# Patient Record
Sex: Female | Born: 1965 | Race: Black or African American | Hispanic: No | Marital: Married | Smoking: Never smoker
Health system: Southern US, Community
[De-identification: ages and names within clinical notes are randomized; demographics above are authoritative.]

## PROBLEM LIST (undated history)

## (undated) DIAGNOSIS — E785 Hyperlipidemia, unspecified: Secondary | ICD-10-CM

## (undated) DIAGNOSIS — R55 Syncope and collapse: Secondary | ICD-10-CM

## (undated) DIAGNOSIS — M79609 Pain in unspecified limb: Secondary | ICD-10-CM

## (undated) DIAGNOSIS — M25559 Pain in unspecified hip: Secondary | ICD-10-CM

## (undated) DIAGNOSIS — M1990 Unspecified osteoarthritis, unspecified site: Secondary | ICD-10-CM

## (undated) DIAGNOSIS — J301 Allergic rhinitis due to pollen: Secondary | ICD-10-CM

## (undated) DIAGNOSIS — K449 Diaphragmatic hernia without obstruction or gangrene: Secondary | ICD-10-CM

## (undated) DIAGNOSIS — E559 Vitamin D deficiency, unspecified: Secondary | ICD-10-CM

## (undated) DIAGNOSIS — T7840XA Allergy, unspecified, initial encounter: Secondary | ICD-10-CM

## (undated) HISTORY — DX: Pain in unspecified hip: M25.559

## (undated) HISTORY — DX: Hyperlipidemia, unspecified: E78.5

## (undated) HISTORY — DX: Vitamin D deficiency, unspecified: E55.9

## (undated) HISTORY — DX: Diaphragmatic hernia without obstruction or gangrene: K44.9

## (undated) HISTORY — DX: Pain in unspecified limb: M79.609

## (undated) HISTORY — DX: Allergic rhinitis due to pollen: J30.1

## (undated) HISTORY — DX: Syncope and collapse: R55

## (undated) HISTORY — DX: Unspecified osteoarthritis, unspecified site: M19.90

## (undated) HISTORY — PX: TUBAL LIGATION: SHX77

## (undated) HISTORY — DX: Allergy, unspecified, initial encounter: T78.40XA

---

## 1998-05-08 ENCOUNTER — Ambulatory Visit (HOSPITAL_COMMUNITY): Admission: RE | Admit: 1998-05-08 | Discharge: 1998-05-08 | Payer: Self-pay | Admitting: Unknown Physician Specialty

## 1998-10-23 ENCOUNTER — Emergency Department (HOSPITAL_COMMUNITY): Admission: EM | Admit: 1998-10-23 | Discharge: 1998-10-23 | Payer: Self-pay | Admitting: Emergency Medicine

## 1998-10-23 ENCOUNTER — Encounter: Payer: Self-pay | Admitting: Emergency Medicine

## 1999-07-05 ENCOUNTER — Ambulatory Visit: Admission: RE | Admit: 1999-07-05 | Discharge: 1999-07-05 | Payer: Self-pay | Admitting: Gynecology

## 1999-08-22 ENCOUNTER — Encounter (INDEPENDENT_AMBULATORY_CARE_PROVIDER_SITE_OTHER): Payer: Self-pay | Admitting: Specialist

## 1999-08-22 ENCOUNTER — Inpatient Hospital Stay (HOSPITAL_COMMUNITY): Admission: RE | Admit: 1999-08-22 | Discharge: 1999-08-23 | Payer: Self-pay | Admitting: Gynecology

## 2000-02-24 ENCOUNTER — Encounter: Admission: RE | Admit: 2000-02-24 | Discharge: 2000-02-24 | Payer: Self-pay | Admitting: Internal Medicine

## 2000-02-24 ENCOUNTER — Encounter: Payer: Self-pay | Admitting: Internal Medicine

## 2000-08-31 ENCOUNTER — Encounter: Payer: Self-pay | Admitting: Internal Medicine

## 2000-08-31 ENCOUNTER — Emergency Department (HOSPITAL_COMMUNITY): Admission: EM | Admit: 2000-08-31 | Discharge: 2000-08-31 | Payer: Self-pay | Admitting: Emergency Medicine

## 2000-09-16 ENCOUNTER — Ambulatory Visit (HOSPITAL_COMMUNITY): Admission: RE | Admit: 2000-09-16 | Discharge: 2000-09-16 | Payer: Self-pay | Admitting: Gastroenterology

## 2000-09-17 ENCOUNTER — Emergency Department (HOSPITAL_COMMUNITY): Admission: EM | Admit: 2000-09-17 | Discharge: 2000-09-17 | Payer: Self-pay | Admitting: Emergency Medicine

## 2000-09-17 ENCOUNTER — Encounter: Payer: Self-pay | Admitting: Emergency Medicine

## 2001-05-25 ENCOUNTER — Encounter: Payer: Self-pay | Admitting: Internal Medicine

## 2001-05-25 ENCOUNTER — Ambulatory Visit (HOSPITAL_COMMUNITY): Admission: RE | Admit: 2001-05-25 | Discharge: 2001-05-25 | Payer: Self-pay | Admitting: Internal Medicine

## 2002-09-30 ENCOUNTER — Encounter: Payer: Self-pay | Admitting: Internal Medicine

## 2002-09-30 ENCOUNTER — Encounter: Admission: RE | Admit: 2002-09-30 | Discharge: 2002-09-30 | Payer: Self-pay | Admitting: Internal Medicine

## 2002-11-08 ENCOUNTER — Other Ambulatory Visit: Admission: RE | Admit: 2002-11-08 | Discharge: 2002-11-08 | Payer: Self-pay | Admitting: Gynecology

## 2003-03-15 ENCOUNTER — Emergency Department (HOSPITAL_COMMUNITY): Admission: EM | Admit: 2003-03-15 | Discharge: 2003-03-15 | Payer: Self-pay | Admitting: Emergency Medicine

## 2003-03-22 ENCOUNTER — Emergency Department (HOSPITAL_COMMUNITY): Admission: EM | Admit: 2003-03-22 | Discharge: 2003-03-22 | Payer: Self-pay | Admitting: Emergency Medicine

## 2003-12-26 ENCOUNTER — Other Ambulatory Visit: Admission: RE | Admit: 2003-12-26 | Discharge: 2003-12-26 | Payer: Self-pay | Admitting: Gynecology

## 2004-03-25 ENCOUNTER — Encounter: Admission: RE | Admit: 2004-03-25 | Discharge: 2004-03-25 | Payer: Self-pay | Admitting: Internal Medicine

## 2005-01-01 ENCOUNTER — Other Ambulatory Visit: Admission: RE | Admit: 2005-01-01 | Discharge: 2005-01-01 | Payer: Self-pay | Admitting: Gynecology

## 2006-02-18 ENCOUNTER — Other Ambulatory Visit: Admission: RE | Admit: 2006-02-18 | Discharge: 2006-02-18 | Payer: Self-pay | Admitting: Gynecology

## 2006-03-17 ENCOUNTER — Encounter: Admission: RE | Admit: 2006-03-17 | Discharge: 2006-03-17 | Payer: Self-pay | Admitting: Internal Medicine

## 2007-02-22 ENCOUNTER — Other Ambulatory Visit: Admission: RE | Admit: 2007-02-22 | Discharge: 2007-02-22 | Payer: Self-pay | Admitting: Gynecology

## 2007-08-17 ENCOUNTER — Encounter: Admission: RE | Admit: 2007-08-17 | Discharge: 2007-08-17 | Payer: Self-pay | Admitting: Internal Medicine

## 2007-09-15 ENCOUNTER — Ambulatory Visit (HOSPITAL_COMMUNITY): Admission: RE | Admit: 2007-09-15 | Discharge: 2007-09-15 | Payer: Self-pay | Admitting: Gastroenterology

## 2007-09-24 ENCOUNTER — Ambulatory Visit (HOSPITAL_COMMUNITY): Admission: RE | Admit: 2007-09-24 | Discharge: 2007-09-24 | Payer: Self-pay | Admitting: Gastroenterology

## 2008-06-19 ENCOUNTER — Other Ambulatory Visit: Admission: RE | Admit: 2008-06-19 | Discharge: 2008-06-19 | Payer: Self-pay | Admitting: Gynecology

## 2008-07-10 ENCOUNTER — Emergency Department (HOSPITAL_COMMUNITY): Admission: EM | Admit: 2008-07-10 | Discharge: 2008-07-10 | Payer: Self-pay | Admitting: Emergency Medicine

## 2008-12-25 ENCOUNTER — Ambulatory Visit (HOSPITAL_COMMUNITY): Admission: RE | Admit: 2008-12-25 | Discharge: 2008-12-25 | Payer: Self-pay | Admitting: Gastroenterology

## 2009-10-24 ENCOUNTER — Encounter: Admission: RE | Admit: 2009-10-24 | Discharge: 2009-11-27 | Payer: Self-pay | Admitting: Internal Medicine

## 2010-01-28 ENCOUNTER — Emergency Department (HOSPITAL_COMMUNITY): Admission: EM | Admit: 2010-01-28 | Discharge: 2010-01-28 | Payer: Self-pay | Admitting: Emergency Medicine

## 2010-02-11 ENCOUNTER — Emergency Department (HOSPITAL_COMMUNITY): Admission: EM | Admit: 2010-02-11 | Discharge: 2010-02-11 | Payer: Self-pay | Admitting: Emergency Medicine

## 2010-12-22 ENCOUNTER — Encounter: Payer: Self-pay | Admitting: Gastroenterology

## 2010-12-22 ENCOUNTER — Encounter: Payer: Self-pay | Admitting: Neurosurgery

## 2011-02-19 ENCOUNTER — Ambulatory Visit
Admission: RE | Admit: 2011-02-19 | Discharge: 2011-02-19 | Disposition: A | Discharge status: Home / Self Care | Payer: BC Managed Care – PPO | Source: Ambulatory Visit | Attending: Internal Medicine | Admitting: Internal Medicine

## 2011-02-19 ENCOUNTER — Other Ambulatory Visit: Payer: Self-pay | Admitting: Internal Medicine

## 2011-02-19 DIAGNOSIS — R52 Pain, unspecified: Secondary | ICD-10-CM

## 2011-03-19 ENCOUNTER — Encounter: Payer: Self-pay | Admitting: Internal Medicine

## 2011-03-20 ENCOUNTER — Other Ambulatory Visit (HOSPITAL_COMMUNITY): Payer: Self-pay | Admitting: Cardiology

## 2011-04-07 ENCOUNTER — Ambulatory Visit (HOSPITAL_COMMUNITY)
Admission: RE | Admit: 2011-04-07 | Discharge: 2011-04-07 | Disposition: A | Discharge status: Home / Self Care | Payer: BC Managed Care – PPO | Source: Ambulatory Visit | Attending: Cardiology | Admitting: Cardiology

## 2011-04-07 ENCOUNTER — Encounter (HOSPITAL_COMMUNITY)
Admission: RE | Admit: 2011-04-07 | Discharge: 2011-04-07 | Disposition: A | Discharge status: Home / Self Care | Payer: BC Managed Care – PPO | Source: Ambulatory Visit | Attending: Cardiology | Admitting: Cardiology

## 2011-04-07 DIAGNOSIS — R079 Chest pain, unspecified: Secondary | ICD-10-CM | POA: Insufficient documentation

## 2011-04-07 DIAGNOSIS — R5381 Other malaise: Secondary | ICD-10-CM | POA: Insufficient documentation

## 2011-04-07 DIAGNOSIS — R42 Dizziness and giddiness: Secondary | ICD-10-CM | POA: Insufficient documentation

## 2011-04-07 MED ORDER — TECHNETIUM TC 99M TETROFOSMIN IV KIT
30.0000 | PACK | Freq: Once | INTRAVENOUS | Status: AC | PRN
  Administered 2011-04-07: 30 via INTRAVENOUS

## 2011-04-07 MED ORDER — TECHNETIUM TC 99M TETROFOSMIN IV KIT
10.0000 | PACK | Freq: Once | INTRAVENOUS | Status: AC | PRN
  Administered 2011-04-07: 10 via INTRAVENOUS

## 2011-04-18 NOTE — Procedures (Signed)
[place]. [facility]  Patient:    Shelby Jensen, Shelby Jensen                       MRN: [id number] Proc. Date: 09/16/00 Adm. Date:  [id number] Attending:  Charna Elizabeth CC:         Lind Guest. August Saucer, M.D.   Procedure Report  DATE OF BIRTH:  August 30, 1956  REFERRING PHYSICIAN:  Lind Guest. August Saucer, M.D.  PROCEDURE PERFORMED:  Colonoscopy.  ENDOSCOPIST:  Anselmo Rod, M.D.  INSTRUMENT USED:  Olympus video colonoscope.  INDICATIONS FOR PROCEDURE:  Blood in stool and diarrhea in a 45 year old black female rule out colitis, masses, polyps, etc.  PREPROCEDURE PREPARATION:  Informed consent was procured from the patient. The patient was fasted for eight hours prior to the procedure and prepped with a bottle of magnesium citrate and a gallon of NuLytely the night prior to the procedure.  PREPROCEDURE PHYSICAL:  The patient had stable vital signs.  Neck supple. Chest clear to auscultation.  S1, S2 regular.  Abdomen soft with normal abdominal bowel sounds.  DESCRIPTION OF PROCEDURE:  The patient was placed in the left lateral decubitus position and sedated with 40 mg of Demerol and 4 mg of Versed intravenously.  Once the patient was adequately sedated and maintained on low-flow oxygen and continuous cardiac monitoring, the Olympus video colonoscope was advanced from the rectum to the cecum and terminal ileum without difficulty.  No abnormalities were noted, no masses polyps, erosions or ulcerations were seen.  There was a small amount of residual stool in the colon but visualization was adequate.  There was no evidence of diverticulosis.  IMPRESSION:  Normal colonoscopy up to terminal ileum.  RECOMMENDATIONS:  The patient was advised to increase the fluid and fiber in her diet and follow up in the office in the next two weeks.DD:  09/16/00 TD:  09/16/00 Job: [id number]

## 2011-04-18 NOTE — Consult Note (Signed)
[place]. [facility]  Patient:    Shelby Jensen, Shelby Jensen                       MRN: [id number] Proc. Date: 08/31/00 Adm. Date:  [id number] Attending:  Sandi Raveling                          Consultation Report  EMERGENCY ROOM NOTE:  CHIEF COMPLAINT:  Abdominal pain.  HISTORY OF PRESENT ILLNESS:  The patient is a 45 year old married black female, who presented to the office complaining of recurrent abdominal pain of one days duration.  States she had been feeling well up until yesterday.  She noted the severe crampy lower abdominal pain.  She subsequently developed diarrhea with intermittent bloody stools noted.  She became flushed without associated syncope.  She notably had had problems with constipation prior to the onset of loose stools.  The patient became concerned and came to the office for evaluation.  Vital signs were stable in the office, but she was referred to the emergency room for the evaluation.  The patient has not had similar episodes of crampy pain.  She has had intermittent bouts of constipation.  She has not seen blood in her stools before.  She denies fever, chills, or night sweats.  No unusual eating habits.  No one in the family has been ill as well.  The patient has not taken any NSAIDs or aspirin products.  She does not drink.  Does not smoke.  She drinks one or two cups of caffeine on a daily basis.  PAST MEDICAL HISTORY:  Remarkable for allergic rhinitis.  She has been on intermittent Allegra or Claritin in the past.  ALLERGIES:  CODEINE.  PHYSICAL EXAMINATION:  GENERAL:  She is a well-developed, well-nourished black female in no acute distress.  VITAL SIGNS:  Blood pressure of 109/67, pulse 61, respiratory rate 18, temperature 98.1.  O2 saturation 98% on room air.  HEENT:  There is no sinus tenderness.  Mild turbinate edema.  TMs are clear.  NECK:  Supple.  No evidence of cervical nodes.  LUNGS:  Clear without  wheezes.  CARDIOVASCULAR:  Normal S1, S2.  No S3.  ABDOMEN:  Bowel sounds are present.  Slightly tympanic.  Dullness to the left lower quadrant.  RECTAL:  Notable for small internal hemorrhoidal tag.  No stool obtainable on exam.  EXTREMITIES:  Negative Homans, no edema.  DIAGNOSTIC EVALUATION:  X-ray of the abdomen shows slight increase in stool without blockage.  Laboratory data:  CBC revealed WBC 8300, hemoglobin 12.6, hematocrit 37.0.  Normal differential.  Sedimentation rate was normal. Electrolytes:  Sodium 136, potassium 3.5, chloride 105, CO2 27, BUN 8, creatinine 0.8, calcium 8.6, total protein 7.2, albumin 3.7, SGOT and SGPT normal, alkaline phosphatase is normal, bilirubin normal.  Amylase 67. Sedimentation rate, as noted, 17.  HOSPITAL COURSE:  The patient was observed in the emergency room over the past six hours.  She notably had no bowel movements.  No evidence of hematochezia. She had no nausea or vomiting.  The patient was given IV fluids for mild rehydration.  She notably on follow-up exam felt well.  She was deemed to be stable for discharge home with outpatient follow-up.  IMPRESSION: 1. Colonic dysfunction. 2. Abdominal pain secondary to spasms. 3. History of rectal bleeding, possibly secondary to internal hemorrhoids,    presently without any observed on observation in the emergency  room.    Sedimentation rate being normal, it is not consistent with an inflammatory    bowel disease at this time.  No strong family history of such as well.  PLAN:  The patient has been given Bentyl 2 mg IM.  She will be given Levsin sublingual for severe spasms.  Have referred the patient to Anselmo Rod, M.D., to be evaluated at 2 p.m. tomorrow.  She may still well benefit from a colon evaluation.  Will also give her Miralax to start using for constipation. Levsin sublingual one q.4h. p.r.n. severe spasms.  Follow up in the office otherwise in one weeks time. DD:   08/31/00 TD:  09/01/00 Job: [id number]

## 2012-01-29 ENCOUNTER — Other Ambulatory Visit: Payer: Self-pay | Admitting: Gastroenterology

## 2012-01-29 DIAGNOSIS — K219 Gastro-esophageal reflux disease without esophagitis: Secondary | ICD-10-CM

## 2012-01-29 DIAGNOSIS — R109 Unspecified abdominal pain: Secondary | ICD-10-CM

## 2012-02-10 ENCOUNTER — Ambulatory Visit (HOSPITAL_COMMUNITY)
Admission: RE | Admit: 2012-02-10 | Discharge: 2012-02-10 | Disposition: A | Discharge status: Home / Self Care | Payer: BC Managed Care – PPO | Source: Ambulatory Visit | Attending: Gastroenterology | Admitting: Gastroenterology

## 2012-02-10 DIAGNOSIS — R109 Unspecified abdominal pain: Secondary | ICD-10-CM | POA: Insufficient documentation

## 2012-02-10 DIAGNOSIS — K219 Gastro-esophageal reflux disease without esophagitis: Secondary | ICD-10-CM

## 2012-02-10 MED ORDER — SINCALIDE 5 MCG IJ SOLR
INTRAMUSCULAR | Status: AC
  Administered 2012-02-10: 1.32 ug via INTRAVENOUS
  Filled 2012-02-10: qty 5

## 2012-02-10 MED ORDER — TECHNETIUM TC 99M MEBROFENIN IV KIT
5.0000 | PACK | Freq: Once | INTRAVENOUS | Status: AC | PRN
  Administered 2012-02-10: 5 via INTRAVENOUS

## 2012-06-10 LAB — LIPID PANEL
Cholesterol: 176 mg/dL (ref 0–200)
Triglycerides: 60 mg/dL (ref 40–160)

## 2012-08-19 ENCOUNTER — Other Ambulatory Visit: Payer: Self-pay | Admitting: Gastroenterology

## 2012-08-19 DIAGNOSIS — R110 Nausea: Secondary | ICD-10-CM

## 2012-08-19 DIAGNOSIS — R1013 Epigastric pain: Secondary | ICD-10-CM

## 2012-09-01 ENCOUNTER — Encounter (HOSPITAL_COMMUNITY)
Admission: RE | Admit: 2012-09-01 | Discharge: 2012-09-01 | Disposition: A | Discharge status: Home / Self Care | Payer: BC Managed Care – PPO | Source: Ambulatory Visit | Attending: Gastroenterology | Admitting: Gastroenterology

## 2012-09-01 DIAGNOSIS — R110 Nausea: Secondary | ICD-10-CM | POA: Insufficient documentation

## 2012-09-01 DIAGNOSIS — R1013 Epigastric pain: Secondary | ICD-10-CM | POA: Insufficient documentation

## 2012-09-01 LAB — HM MAMMOGRAPHY

## 2012-09-01 MED ORDER — TECHNETIUM TC 99M SULFUR COLLOID
2.0000 | Freq: Once | INTRAVENOUS | Status: AC | PRN
  Administered 2012-09-01: 2 via ORAL

## 2013-01-28 ENCOUNTER — Encounter: Payer: Self-pay | Admitting: General Practice

## 2013-01-28 DIAGNOSIS — F439 Reaction to severe stress, unspecified: Secondary | ICD-10-CM | POA: Insufficient documentation

## 2013-01-28 DIAGNOSIS — E669 Obesity, unspecified: Secondary | ICD-10-CM | POA: Insufficient documentation

## 2014-10-22 ENCOUNTER — Emergency Department (HOSPITAL_COMMUNITY)
Admission: EM | Admit: 2014-10-22 | Discharge: 2014-10-22 | Disposition: A | Discharge status: Home / Self Care | Payer: BC Managed Care – PPO | Attending: Emergency Medicine | Admitting: Emergency Medicine

## 2014-10-22 ENCOUNTER — Encounter (HOSPITAL_COMMUNITY): Payer: Self-pay | Admitting: Emergency Medicine

## 2014-10-22 DIAGNOSIS — E559 Vitamin D deficiency, unspecified: Secondary | ICD-10-CM | POA: Diagnosis not present

## 2014-10-22 DIAGNOSIS — Z8719 Personal history of other diseases of the digestive system: Secondary | ICD-10-CM | POA: Insufficient documentation

## 2014-10-22 DIAGNOSIS — Z79899 Other long term (current) drug therapy: Secondary | ICD-10-CM | POA: Insufficient documentation

## 2014-10-22 DIAGNOSIS — G43009 Migraine without aura, not intractable, without status migrainosus: Secondary | ICD-10-CM | POA: Diagnosis not present

## 2014-10-22 DIAGNOSIS — G43909 Migraine, unspecified, not intractable, without status migrainosus: Secondary | ICD-10-CM | POA: Diagnosis present

## 2014-10-22 MED ORDER — DIPHENHYDRAMINE HCL 50 MG/ML IJ SOLN
12.5000 mg | Freq: Once | INTRAMUSCULAR | Status: AC
  Administered 2014-10-22: 12.5 mg via INTRAVENOUS
  Filled 2014-10-22: qty 1

## 2014-10-22 MED ORDER — KETOROLAC TROMETHAMINE 30 MG/ML IJ SOLN
30.0000 mg | Freq: Once | INTRAMUSCULAR | Status: AC
  Administered 2014-10-22: 30 mg via INTRAVENOUS
  Filled 2014-10-22: qty 1

## 2014-10-22 MED ORDER — METOCLOPRAMIDE HCL 5 MG/ML IJ SOLN
10.0000 mg | Freq: Once | INTRAMUSCULAR | Status: AC
  Administered 2014-10-22: 10 mg via INTRAVENOUS
  Filled 2014-10-22: qty 2

## 2014-10-22 NOTE — ED Provider Notes (Signed)
CSN: [id number]     Arrival date & time 10/22/14  0307 History   First MD Initiated Contact with Patient 10/22/14 0320     Chief Complaint  Patient presents with  . Migraine     (Consider location/radiation/quality/duration/timing/severity/associated sxs/prior Treatment) Patient is a 48 y.o. female presenting with migraines. The history is provided by the patient. No language interpreter was used.  Migraine This is a new problem. The current episode started today. Associated symptoms include headaches. Pertinent negatives include no chills, fever, nausea or vomiting. Associated symptoms comments: Left sided headache in pattern of previous migraine headaches. She states she was formerly a patient at the [facility] but has not required treatment for a long time. No fever, injury, nausea or vomiting. Positive for photophobia..    Past Medical History  Diagnosis Date  . Syncope and collapse   . Pain in limb   . Unspecified vitamin D deficiency   . Allergic rhinitis due to pollen   . Pain in joint, pelvic region and thigh   . Diaphragmatic hernia without mention of obstruction or gangrene   . Hyperlipidemia    History reviewed. No pertinent past surgical history. History reviewed. No pertinent family history. History  Substance Use Topics  . Smoking status: Never Smoker   . Smokeless tobacco: Never Used  . Alcohol Use: No   OB History    No data available     Review of Systems  Constitutional: Negative for fever and chills.  Eyes: Positive for photophobia.  Respiratory: Negative.   Cardiovascular: Negative.   Gastrointestinal: Negative.  Negative for nausea and vomiting.  Musculoskeletal: Negative.   Skin: Negative.   Neurological: Positive for headaches.      Allergies  Codeine; Darvocet; and Tramadol  Home Medications   Prior to Admission medications   Medication Sig Start Date End Date Taking? Authorizing Provider  cholecalciferol (VITAMIN D) 1000  UNITS tablet Take 1,000 Units by mouth daily.    Historical Provider, MD  Multiple Vitamin (MULTIVITAMIN) tablet Take 1 tablet by mouth daily.      Historical Provider, MD   BP 120/48 mmHg  Pulse 63  Temp(Src) 97.6 F (36.4 C) (Oral)  Resp 20  Ht 5\' 3"  (1.6 m)  Wt 163 lb (73.936 kg)  BMI 28.88 kg/m2  SpO2 100%  LMP 12/01/1998 Physical Exam  Constitutional: She is oriented to person, place, and time. She appears well-developed and well-nourished.  HENT:  Head: Normocephalic.  Eyes: Pupils are equal, round, and reactive to light.  Neck: Normal range of motion. Neck supple.  Cardiovascular: Normal rate and regular rhythm.   Pulmonary/Chest: Effort normal and breath sounds normal.  Abdominal: Soft. Bowel sounds are normal. There is no tenderness. There is no rebound and no guarding.  Musculoskeletal: Normal range of motion.  Neurological: She is alert and oriented to person, place, and time. She has normal strength and normal reflexes. No cranial nerve deficit or sensory deficit. She displays a negative Romberg sign. Coordination normal.  Skin: Skin is warm and dry. No rash noted.  Psychiatric: She has a normal mood and affect.    ED Course  Procedures (including critical care time) Labs Review Labs Reviewed - No data to display  Imaging Review No results found.   EKG Interpretation None      MDM   Final diagnoses:  None    1. Migraine headache  Headache is better with IV headache cocktail. She has outpatient provider in place for follow up.  Stable for discharge.     Arnoldo HookerShari A Hiedi Touchton, PA-C 10/22/14 [id number]  Loren Raceravid Yelverton, MD 10/22/14 Jeralyn [id number]

## 2014-10-22 NOTE — Discharge Instructions (Signed)

## 2014-10-22 NOTE — ED Notes (Signed)
Awake. Verbally responsive. Resp even and unlabored. ABC's intact. IV saline lock patent and intact. Lights off in room. Pt denies nausea. Reported having blurred vision. Family at bedside.

## 2014-10-22 NOTE — ED Notes (Signed)
Pt arrived to the ED with a complaint of a migraine.  Pt states that she does have a history of same but has not been prescribed medication for some time.  Pain is located on the left side of her head.

## 2014-10-22 NOTE — ED Notes (Addendum)
Resting quietly with eye closed. Easily arousable. Verbally responsive. Resp even and unlabored. ABC's intact. IV saline lock patent and intact.  

## 2016-02-14 ENCOUNTER — Ambulatory Visit (INDEPENDENT_AMBULATORY_CARE_PROVIDER_SITE_OTHER): Payer: BLUE CROSS/BLUE SHIELD | Admitting: Family Medicine

## 2016-02-14 VITALS — BP 122/76 | HR 73 | Temp 98.5°F | Resp 16 | Ht 62.0 in | Wt 151.0 lb

## 2016-02-14 DIAGNOSIS — R52 Pain, unspecified: Secondary | ICD-10-CM

## 2016-02-14 DIAGNOSIS — R519 Headache, unspecified: Secondary | ICD-10-CM

## 2016-02-14 DIAGNOSIS — R05 Cough: Secondary | ICD-10-CM

## 2016-02-14 DIAGNOSIS — R059 Cough, unspecified: Secondary | ICD-10-CM

## 2016-02-14 DIAGNOSIS — R6889 Other general symptoms and signs: Secondary | ICD-10-CM | POA: Diagnosis not present

## 2016-02-14 DIAGNOSIS — R51 Headache: Secondary | ICD-10-CM

## 2016-02-14 LAB — POCT INFLUENZA A/B
Influenza A, POC: NEGATIVE
Influenza B, POC: NEGATIVE

## 2016-02-14 MED ORDER — OSELTAMIVIR PHOSPHATE 75 MG PO CAPS: 75.0000 mg | ORAL_CAPSULE | Freq: Two times a day (BID) | ORAL | Status: DC

## 2016-02-14 MED ORDER — BENZONATATE 100 MG PO CAPS: 100.0000 mg | ORAL_CAPSULE | Freq: Three times a day (TID) | ORAL | Status: DC | PRN

## 2016-02-14 MED ORDER — AMOXICILLIN 875 MG PO TABS: 875.0000 mg | ORAL_TABLET | Freq: Two times a day (BID) | ORAL | Status: DC

## 2016-02-14 NOTE — Progress Notes (Signed)
Patient ID: Shelby Jensen, female    DOB: 1966/10/22  Age: 50 y.o. MRN: [id number]  Chief Complaint  Patient presents with  . Sore Throat    x 1 day  . Headache  . Chills  . Laryngitis    Subjective:   Patient has been ill as noted above since yesterday. She had a upper respiratory virus like infection last week. That didn't seem to improve and then she got sick yesterday. She aches. She has a headache and sore throat and hoarseness. She does not smoke. She did not have a flu shot this year. She works at a similar Type job. She had to leave work today she was sick.  She did have a respiratory tract infection last week and then this is flared up after she was doing better.  Current allergies, medications, problem list, past/family and social histories reviewed.  Objective:  BP 122/76 mmHg  Pulse 73  Temp(Src) 98.5 F (36.9 C) (Oral)  Resp 16  Ht  (1.575 m)  Wt 151 lb (68.493 kg)  BMI 27.61 kg/m2  SpO2 99%  LMP 12/01/1998  Ill-appearing lady, congested, mild cough. Her TMs are normal. Throat not erythematous. Neck supple without any anterior cervical nodes. Chest is clear to auscultation. Heart regular without murmur.  Assessment & Plan:   Assessment: 1. Flu-like symptoms   2. Body aches   3. Acute nonintractable headache, unspecified headache type   4. Cough       Plan: Has a flulike illness. Will check flu swab and go from there.  Orders Placed This Encounter  Procedures  . POCT Influenza A/B    Meds ordered this encounter  Medications  . Calcium-Magnesium-Vitamin D (CALCIUM 500 PO)    Sig: Take by mouth.  . Multiple Vitamins-Minerals (ONE-A-DAY 50 PLUS PO)    Sig: Take by mouth.  . Cholecalciferol (MAXIMUM D3) 10000 units CAPS    Sig: Take by mouth.  . oseltamivir (TAMIFLU) 75 MG capsule    Sig: Take 1 capsule (75 mg total) by mouth 2 (two) times daily.    Dispense:  10 capsule    Refill:  0  . amoxicillin (AMOXIL) 875 MG tablet    Sig: Take 1  tablet (875 mg total) by mouth 2 (two) times daily.    Dispense:  20 tablet    Refill:  0  . benzonatate (TESSALON) 100 MG capsule    Sig: Take 1-2 capsules (100-200 mg total) by mouth 3 (three) times daily as needed.    Dispense:  30 capsule    Refill:  0    Results for orders placed or performed in visit on 02/14/16  POCT Influenza A/B  Result Value Ref Range   Influenza A, POC Negative Negative   Influenza B, POC Negative Negative    This could be a secondary infection from the previous viral symptoms she had had last week, or it could be a flu or other viral bug with the flu test being negative. She looks sick enough that I think I'm going to treat her both ways.    Patient Instructions  Drink plenty of fluids and get enough rest  Take amoxicillin 875 mg one twice daily  Take the Tamiflu 75 mg one twice daily  Use the benzonatate cough pills one or 2 pills 3 times daily as needed for cough. Since I cannot give you the hydrocodone-containing cough syrup, I recommend you get some over-the-counter Delsym cough syrup or pills and take that  as directed on the bottle  Return if getting worse at anytime  Stay off work through tomorrow and through the weekend   Influenza, Adult Influenza ("the flu") is a viral infection of the respiratory tract. It occurs more often in winter months because people spend more time in close contact with one another. Influenza can make you feel very sick. Influenza easily spreads from person to person (contagious). CAUSES  Influenza is caused by a virus that infects the respiratory tract. You can catch the virus by breathing in droplets from an infected person's cough or sneeze. You can also catch the virus by touching something that was recently contaminated with the virus and then touching your mouth, nose, or eyes. RISKS AND COMPLICATIONS You may be at risk for a more severe case of influenza if you smoke cigarettes, have diabetes, have chronic  heart disease (such as heart failure) or lung disease (such as asthma), or if you have a weakened immune system. Elderly people and pregnant women are also at risk for more serious infections. The most common problem of influenza is a lung infection (pneumonia). Sometimes, this problem can require emergency medical care and may be life threatening. SIGNS AND SYMPTOMS  Symptoms typically last 4 to 10 days and may include:  Fever.  Chills.  Headache, body aches, and muscle aches.  Sore throat.  Chest discomfort and cough.  Poor appetite.  Weakness or feeling tired.  Dizziness.  Nausea or vomiting. DIAGNOSIS  Diagnosis of influenza is often made based on your history and a physical exam. A nose or throat swab test can be done to confirm the diagnosis. TREATMENT  In mild cases, influenza goes away on its own. Treatment is directed at relieving symptoms. For more severe cases, your health care provider may prescribe antiviral medicines to shorten the sickness. Antibiotic medicines are not effective because the infection is caused by a virus, not by bacteria. HOME CARE INSTRUCTIONS  Take medicines only as directed by your health care provider.  Use a cool mist humidifier to make breathing easier.  Get plenty of rest until your temperature returns to normal. This usually takes 3 to 4 days.  Drink enough fluid to keep your urine clear or pale yellow.  Cover yourmouth and nosewhen coughing or sneezing,and wash your handswellto prevent thevirusfrom spreading.  Stay homefromwork orschool untilthe fever is gonefor at least 291full day. PREVENTION  An annual influenza vaccination (flu shot) is the best way to avoid getting influenza. An annual flu shot is now routinely recommended for all adults in the [place] SEEK MEDICAL CARE IF:  You experiencechest pain, yourcough worsens,or you producemore mucus.  Youhave nausea,vomiting, ordiarrhea.  Your fever returns or gets  worse. SEEK IMMEDIATE MEDICAL CARE IF:  You havetrouble breathing, you become short of breath,or your skin ornails becomebluish.  You have severe painor stiffnessin the neck.  You develop a sudden headache, or pain in the face or ear.  You have nausea or vomiting that you cannot control. MAKE SURE YOU:   Understand these instructions.  Will watch your condition.  Will get help right away if you are not doing well or get worse.   This information is not intended to replace advice given to you by your health care provider. Make sure you discuss any questions you have with your health care provider.   Document Released: 11/14/2000 Document Revised: 12/08/2014 Document Reviewed: 02/16/2012 Elsevier Interactive Patient Education [employer]2016 Elsevier Inc.   IF you received an x-ray today, you will  receive an Economist from [facility]. Please contact [facility] at [phone] with questions or concerns regarding your invoice.   IF you received labwork today, you will receive an invoice from [employer]. Please contact [facility] at [phone] with questions or concerns regarding your invoice.   Our billing staff will not be able to assist you with questions regarding bills from these companies.  You will be contacted with the lab results as soon as they are available. The fastest way to get your results is to activate your My Chart account. Instructions are located on the last page of this paperwork. If you have not heard from [place] regarding the results in 2 weeks, please contact this office.      Return if symptoms worsen or fail to improve.   Chelesea Weiand, MD 02/14/2016

## 2016-02-14 NOTE — Patient Instructions (Addendum)
Drink plenty of fluids and get enough rest  Take amoxicillin 875 mg one twice daily  Take the Tamiflu 75 mg one twice daily  Use the benzonatate cough pills one or 2 pills 3 times daily as needed for cough. Since I cannot give you the hydrocodone-containing cough syrup, I recommend you get some over-the-counter Delsym cough syrup or pills and take that as directed on the bottle  Return if getting worse at anytime  Stay off work through tomorrow and through the weekend   Influenza, Adult Influenza ("the flu") is a viral infection of the respiratory tract. It occurs more often in winter months because people spend more time in close contact with one another. Influenza can make you feel very sick. Influenza easily spreads from person to person (contagious). CAUSES  Influenza is caused by a virus that infects the respiratory tract. You can catch the virus by breathing in droplets from an infected person's cough or sneeze. You can also catch the virus by touching something that was recently contaminated with the virus and then touching your mouth, nose, or eyes. RISKS AND COMPLICATIONS You may be at risk for a more severe case of influenza if you smoke cigarettes, have diabetes, have chronic heart disease (such as heart failure) or lung disease (such as asthma), or if you have a weakened immune system. Elderly people and pregnant women are also at risk for more serious infections. The most common problem of influenza is a lung infection (pneumonia). Sometimes, this problem can require emergency medical care and may be life threatening. SIGNS AND SYMPTOMS  Symptoms typically last 4 to 10 days and may include:  Fever.  Chills.  Headache, body aches, and muscle aches.  Sore throat.  Chest discomfort and cough.  Poor appetite.  Weakness or feeling tired.  Dizziness.  Nausea or vomiting. DIAGNOSIS  Diagnosis of influenza is often made based on your history and a physical exam. A nose or  throat swab test can be done to confirm the diagnosis. TREATMENT  In mild cases, influenza goes away on its own. Treatment is directed at relieving symptoms. For more severe cases, your health care provider may prescribe antiviral medicines to shorten the sickness. Antibiotic medicines are not effective because the infection is caused by a virus, not by bacteria. HOME CARE INSTRUCTIONS  Take medicines only as directed by your health care provider.  Use a cool mist humidifier to make breathing easier.  Get plenty of rest until your temperature returns to normal. This usually takes 3 to 4 days.  Drink enough fluid to keep your urine clear or pale yellow.  Cover yourmouth and nosewhen coughing or sneezing,and wash your handswellto prevent thevirusfrom spreading.  Stay homefromwork orschool untilthe fever is gonefor at least 671full day. PREVENTION  An annual influenza vaccination (flu shot) is the best way to avoid getting influenza. An annual flu shot is now routinely recommended for all adults in the [place] SEEK MEDICAL CARE IF:  You experiencechest pain, yourcough worsens,or you producemore mucus.  Youhave nausea,vomiting, ordiarrhea.  Your fever returns or gets worse. SEEK IMMEDIATE MEDICAL CARE IF:  You havetrouble breathing, you become short of breath,or your skin ornails becomebluish.  You have severe painor stiffnessin the neck.  You develop a sudden headache, or pain in the face or ear.  You have nausea or vomiting that you cannot control. MAKE SURE YOU:   Understand these instructions.  Will watch your condition.  Will get help right away if you are not  doing well or get worse.   This information is not intended to replace advice given to you by your health care provider. Make sure you discuss any questions you have with your health care provider.   Document Released: 11/14/2000 Document Revised: 12/08/2014 Document Reviewed:  02/16/2012 Elsevier Interactive Patient Education 2016 [employer].   IF you received an x-ray today, you will receive an invoice from [facility]. Please contact [facility] at [phone] with questions or concerns regarding your invoice.   IF you received labwork today, you will receive an invoice from [employer]. Please contact [facility] at [phone] with questions or concerns regarding your invoice.   Our billing staff will not be able to assist you with questions regarding bills from these companies.  You will be contacted with the lab results as soon as they are available. The fastest way to get your results is to activate your My Chart account. Instructions are located on the last page of this paperwork. If you have not heard from [place] regarding the results in 2 weeks, please contact this office.

## 2016-02-16 ENCOUNTER — Ambulatory Visit (INDEPENDENT_AMBULATORY_CARE_PROVIDER_SITE_OTHER): Payer: BLUE CROSS/BLUE SHIELD | Admitting: Family Medicine

## 2016-02-16 VITALS — BP 101/70 | HR 60 | Temp 98.9°F | Ht 61.0 in | Wt 151.0 lb

## 2016-02-16 DIAGNOSIS — R590 Localized enlarged lymph nodes: Secondary | ICD-10-CM

## 2016-02-16 DIAGNOSIS — R6889 Other general symptoms and signs: Secondary | ICD-10-CM

## 2016-02-16 DIAGNOSIS — M542 Cervicalgia: Secondary | ICD-10-CM | POA: Diagnosis not present

## 2016-02-16 LAB — POCT CBC
Granulocyte percent: 57.9 %G (ref 37–80)
HCT, POC: 39.9 % (ref 37.7–47.9)
HEMOGLOBIN: 13.8 g/dL (ref 12.2–16.2)
LYMPH, POC: 2.8 (ref 0.6–3.4)
MCH, POC: 29.7 pg (ref 27–31.2)
MCHC: 34.6 g/dL (ref 31.8–35.4)
MCV: 85.7 fL (ref 80–97)
MID (CBC): 0.3 (ref 0–0.9)
MPV: 8.1 fL (ref 0–99.8)
PLATELET COUNT, POC: 237 10*3/uL (ref 142–424)
POC Granulocyte: 4.2 (ref 2–6.9)
POC LYMPH PERCENT: 38.3 %L (ref 10–50)
POC MID %: 3.8 % (ref 0–12)
RBC: 4.65 M/uL (ref 4.04–5.48)
RDW, POC: 12.5 %
WBC: 7.2 10*3/uL (ref 4.6–10.2)

## 2016-02-16 LAB — POCT INFLUENZA A/B
INFLUENZA B, POC: NEGATIVE
Influenza A, POC: NEGATIVE

## 2016-02-16 NOTE — Patient Instructions (Addendum)
The labs continue to appear to be just a bad viral infection. He do have a swollen gland in your neck, and if it is a bacterial infection the antibiotic should help take care of that. Our options are limited because she cannot take the pain medications.  Take ibuprofen 800 mg (200 x4) 3 times daily.  In addition to that you can take acetaminophen (Tylenol) 500 mg 2 tablets 3 times daily  Drink plenty of fluids and get enough rest  You can try a Robitussin DM type cough syrup, try initially a small amount to make sure you tolerate it.  If worse go to the emergency room. If not improving over the next 2 or 3 days return for a recheck. There is not a lot else to be done differently at this time.        IF you received an x-ray today, you will receive an invoice from [facility]. Please contact [facility] at [phone] with questions or concerns regarding your invoice.   IF you received labwork today, you will receive an invoice from [facility]. Please contact [facility] at [phone] with questions or concerns regarding your invoice.   Our billing staff will not be able to assist you with questions regarding bills from these companies.  You will be contacted with the lab results as soon as they are available. The fastest way to get your results is to activate your My Chart account. Instructions are located on the last page of this paperwork. If you have not heard from us regarding the results in 2 weeks, please contact this office.

## 2016-02-16 NOTE — Progress Notes (Signed)
Patient ID: Shelby Jensen, female    DOB: Apr 16, 1966  Age: 50 y.o. MRN: [id number]  Chief Complaint  Patient presents with  . Sore Throat    Left side of neck sore/swollen, hard to swallow x 3 days  . Headache    dx w/flu  x 2 days     Subjective:   Patient was seen 2 days ago. She's had a hard time swallowing because she hurts in the left side of her neck. She is continue to have a bad headache. She was diagnosed clinically with flu but the flu swab was negative. She was treated with antibiotics and due to the nature of her illness the other day. Strep is negative. She has not been running fever. She feels terrible. She is coughing and congested.  Objective: TMs are normal. Throat not erythematous. Neck supple has a tender node on the left. Chest clear. Heart regular without murmur. Himself without mass or tenderness. Has been nauseous without vomiting.  Current allergies, medications, problem list, past/family and social histories reviewed.  Objective:  BP 101/70 mmHg  Pulse 60  Temp(Src) 98.9 F (37.2 C) (Oral)  Ht 5\' 1"  (1.549 m)  Wt 151 lb (68.493 kg)  BMI 28.55 kg/m2  SpO2 99%  LMP 12/01/1998  Ill-appearing. Her TMs are normal. Throat clear. Had a little congested. Neck supple without nodes on the left side of the neck which is tender. She has no trouble moving the neck around but it really does hurt her over there. Her chest is clear. Heart regular without murmur. Soft without mass tenderness.   Assessment & Plan:   Assessment: 1. Flu-like symptoms   2. Cervical lymphadenopathy   3. Neck pain       Plan: Flulike illness. Recheck some labs  Orders Placed This Encounter  Procedures  . COMPLETE METABOLIC PANEL WITH GFR  . POCT CBC  . POCT Influenza A/B   Results for orders placed or performed in visit on 02/16/16  POCT CBC  Result Value Ref Range   WBC 7.2 4.6 - 10.2 K/uL   Lymph, poc 2.8 0.6 - 3.4   POC LYMPH PERCENT 38.3 10 - 50 %L   MID (cbc) 0.3 0 - 0.9    POC MID % 3.8 0 - 12 %M   POC Granulocyte 4.2 2 - 6.9   Granulocyte percent 57.9 37 - 80 %G   RBC 4.65 4.04 - 5.48 M/uL   Hemoglobin 13.8 12.2 - 16.2 g/dL   HCT, POC 40.939.9 81.137.7 - 47.9 %   MCV 85.7 80 - 97 fL   MCH, POC 29.7 27 - 31.2 pg   MCHC 34.6 31.8 - 35.4 g/dL   RDW, POC 91.412.5 %   Platelet Count, POC 237 142 - 424 K/uL   MPV 8.1 0 - 99.8 fL  POCT Influenza A/B  Result Value Ref Range   Influenza A, POC Negative Negative   Influenza B, POC Negative Negative    No orders of the defined types were placed in this encounter.     I'm afraid this is just a bad flulike illness and is going to have to run its course. Talk to them about this.    Patient Instructions   The labs continue to appear to be just a bad viral infection. He do have a swollen gland in your neck, and if it is a bacterial infection the antibiotic should help take care of that. Our options are limited because she cannot take the  pain medications.  Take ibuprofen 800 mg (200 x4) 3 times daily.  In addition to that you can take acetaminophen (Tylenol) 500 mg 2 tablets 3 times daily  Drink plenty of fluids and get enough rest  You can try a Robitussin DM type cough syrup, try initially a small amount to make sure you tolerate it.  If worse go to the emergency room. If not improving over the next 2 or 3 days return for a recheck. There is not a lot else to be done differently at this time.        IF you received an x-ray today, you will receive an invoice from [facility]. Please contact [facility] at [phone] with questions or concerns regarding your invoice.   IF you received labwork today, you will receive an invoice from [employer]. Please contact [facility] at [phone] with questions or concerns regarding your invoice.   Our billing staff will not be able to assist you with questions regarding bills from these companies.  You will be contacted  with the lab results as soon as they are available. The fastest way to get your results is to activate your My Chart account. Instructions are located on the last page of this paperwork. If you have not heard from [place] regarding the results in 2 weeks, please contact this office.          No Follow-up on file.   Tom Ragsdale, MD 02/16/2016

## 2016-02-17 LAB — COMPLETE METABOLIC PANEL WITH GFR
ALT: 9 U/L (ref 6–29)
AST: 14 U/L (ref 10–35)
Albumin: 4.2 g/dL (ref 3.6–5.1)
Alkaline Phosphatase: 39 U/L (ref 33–130)
BILIRUBIN TOTAL: 0.4 mg/dL (ref 0.2–1.2)
BUN: 11 mg/dL (ref 7–25)
CALCIUM: 9.3 mg/dL (ref 8.6–10.4)
CHLORIDE: 106 mmol/L (ref 98–110)
CO2: 27 mmol/L (ref 20–31)
CREATININE: 0.85 mg/dL (ref 0.50–1.05)
GFR, Est Non African American: 80 mL/min (ref >=60)
Glucose, Bld: 90 mg/dL (ref 65–99)
Potassium: 4.7 mmol/L (ref 3.5–5.3)
Sodium: 142 mmol/L (ref 135–146)
TOTAL PROTEIN: 7.1 g/dL (ref 6.1–8.1)

## 2016-02-19 ENCOUNTER — Telehealth: Payer: Self-pay

## 2016-02-19 NOTE — Telephone Encounter (Signed)
She would like it to say RTW on 02/20/16-she just called back.

## 2016-02-19 NOTE — Telephone Encounter (Signed)
Pt was seen here on 3/16 by Dr. Alwyn RenHopper for Flu-like symptoms - Primary. She has received two RTW letters from us. I only see one. She would like a new one stating-she was seen on 3/16 and would RTW 02/19/16. Please advise at [phone]

## 2016-02-20 NOTE — Telephone Encounter (Signed)
Advised pt to pick up letter.

## 2016-02-20 NOTE — Telephone Encounter (Signed)
Patient called to check on the return to work note.  Patient does not want the note to state her diagnosis, only that she was seen in the clinic on 3/16 and to excuse her absence from work on Friday, Monday, and Tuesday.  She wants the note to state that she can return to work on 02/20/16.  CB#: [phone]  Placing as high priority since she already called about this, and she has had issues with the letter being correct.

## 2018-02-19 ENCOUNTER — Encounter (HOSPITAL_COMMUNITY): Payer: Self-pay

## 2018-02-19 ENCOUNTER — Emergency Department (HOSPITAL_COMMUNITY)
Admission: EM | Admit: 2018-02-19 | Discharge: 2018-02-19 | Disposition: A | Discharge status: Home / Self Care | Payer: BLUE CROSS/BLUE SHIELD | Attending: Emergency Medicine | Admitting: Emergency Medicine

## 2018-02-19 ENCOUNTER — Other Ambulatory Visit: Payer: Self-pay

## 2018-02-19 ENCOUNTER — Emergency Department (HOSPITAL_COMMUNITY): Payer: BLUE CROSS/BLUE SHIELD

## 2018-02-19 DIAGNOSIS — R1032 Left lower quadrant pain: Secondary | ICD-10-CM | POA: Insufficient documentation

## 2018-02-19 DIAGNOSIS — Z79899 Other long term (current) drug therapy: Secondary | ICD-10-CM | POA: Diagnosis not present

## 2018-02-19 DIAGNOSIS — R110 Nausea: Secondary | ICD-10-CM | POA: Insufficient documentation

## 2018-02-19 LAB — CBC
HEMATOCRIT: 37.5 % (ref 36.0–46.0)
HEMOGLOBIN: 12.4 g/dL (ref 12.0–15.0)
MCH: 28.3 pg (ref 26.0–34.0)
MCHC: 33.1 g/dL (ref 30.0–36.0)
MCV: 85.6 fL (ref 78.0–100.0)
Platelets: 331 10*3/uL (ref 150–400)
RBC: 4.38 MIL/uL (ref 3.87–5.11)
RDW: 12.9 % (ref 11.5–15.5)
WBC: 5 10*3/uL (ref 4.0–10.5)

## 2018-02-19 LAB — URINALYSIS, ROUTINE W REFLEX MICROSCOPIC
BILIRUBIN URINE: NEGATIVE
GLUCOSE, UA: NEGATIVE mg/dL
HGB URINE DIPSTICK: NEGATIVE
Ketones, ur: NEGATIVE mg/dL
Leukocytes, UA: NEGATIVE
NITRITE: NEGATIVE
PH: 6 (ref 5.0–8.0)
Protein, ur: NEGATIVE mg/dL
Specific Gravity, Urine: 1.009 (ref 1.005–1.030)

## 2018-02-19 LAB — COMPREHENSIVE METABOLIC PANEL
ALBUMIN: 4 g/dL (ref 3.5–5.0)
ALT: 17 U/L (ref 14–54)
ANION GAP: 8 (ref 5–15)
AST: 21 U/L (ref 15–41)
Alkaline Phosphatase: 50 U/L (ref 38–126)
BILIRUBIN TOTAL: 0.6 mg/dL (ref 0.3–1.2)
BUN: 15 mg/dL (ref 6–20)
CO2: 28 mmol/L (ref 22–32)
Calcium: 9 mg/dL (ref 8.9–10.3)
Chloride: 106 mmol/L (ref 101–111)
Creatinine, Ser: 0.84 mg/dL (ref 0.44–1.00)
GFR calc Af Amer: >60 mL/min (ref >60)
GFR calc non Af Amer: >60 mL/min (ref >60)
GLUCOSE: 110 mg/dL — AB (ref 65–99)
Potassium: 3.6 mmol/L (ref 3.5–5.1)
SODIUM: 142 mmol/L (ref 135–145)
TOTAL PROTEIN: 7.3 g/dL (ref 6.5–8.1)

## 2018-02-19 LAB — LIPASE, BLOOD: Lipase: 26 U/L (ref 11–51)

## 2018-02-19 MED ORDER — KETOROLAC TROMETHAMINE 60 MG/2ML IM SOLN
30.0000 mg | Freq: Once | INTRAMUSCULAR | Status: AC
  Administered 2018-02-19: 30 mg via INTRAMUSCULAR
  Filled 2018-02-19: qty 2

## 2018-02-19 MED ORDER — FENTANYL CITRATE (PF) 100 MCG/2ML IJ SOLN: 50.0000 ug | Freq: Once | INTRAMUSCULAR | Status: DC

## 2018-02-19 MED ORDER — IBUPROFEN 800 MG PO TABS: 800.0000 mg | ORAL_TABLET | Freq: Three times a day (TID) | ORAL | 0 refills | Status: DC

## 2018-02-19 NOTE — ED Notes (Signed)
US in process at bedside

## 2018-02-19 NOTE — ED Provider Notes (Signed)
[facility]-EMERGENCY DEPT Provider Note   CSN: [id number] Arrival date & time: 02/19/18  1119     History   Chief Complaint Chief Complaint  Patient presents with  . Abdominal Pain  . Nausea    HPI Shelby Jensen is a 52 y.o. female with no pertinent past medical history presenting with 1 month of intermittent left lower abdominal pain.  She was seen about a week ago and had a CT scan following a calcification seen on x-ray but CT was normal.  She denies any nausea, vomiting, diarrhea, fever, chills or other symptoms.  Denies alleviating or aggravating factors.  Last bowel movement yesterday and normal.  No history of constipation.  Has had a hysterectomy.  No urinary symptoms or vaginal discharge.   HPI  Past Medical History:  Diagnosis Date  . Allergic rhinitis due to pollen   . Allergy   . Arthritis   . Diaphragmatic hernia without mention of obstruction or gangrene   . Hyperlipidemia   . Pain in joint, pelvic region and thigh   . Pain in limb   . Syncope and collapse   . Unspecified vitamin D deficiency     Patient Active Problem List   Diagnosis Date Noted  . Mild obesity 01/28/2013  . Situational stress 01/28/2013    Past Surgical History:  Procedure Laterality Date  . CESAREAN SECTION    . TUBAL LIGATION       OB History   None      Home Medications    Prior to Admission medications   Medication Sig Start Date End Date Taking? Authorizing Provider  Cholecalciferol (MAXIMUM D3) 10000 units CAPS Take 10,000 Units by mouth daily.    Yes [provider]  IRON PO Take 1 tablet by mouth daily.   Yes [provider]  naproxen sodium (ALEVE) 220 MG tablet Take 440 mg by mouth 2 (two) times daily as needed (pain).   Yes [provider]  ibuprofen (ADVIL,MOTRIN) 800 MG tablet Take 1 tablet (800 mg total) by mouth 3 (three) times daily. 02/19/18   Georgiana Shore, PA-C    Family History Family History    Problem Relation Age of Onset  . Cancer Mother   . Heart disease Father     Social History Social History   Tobacco Use  . Smoking status: Never Smoker  . Smokeless tobacco: Never Used  Substance Use Topics  . Alcohol use: No  . Drug use: No     Allergies   Codeine; Darvocet [propoxyphene n-acetaminophen]; and Tramadol   Review of Systems Review of Systems  Constitutional: Negative for chills and fever.  HENT: Negative for congestion, ear pain and sore throat.   Respiratory: Negative for cough, choking, chest tightness, shortness of breath, wheezing and stridor.   Cardiovascular: Negative for chest pain, palpitations and leg swelling.  Gastrointestinal: Positive for abdominal pain. Negative for abdominal distention, anal bleeding, blood in stool, constipation, diarrhea, nausea and vomiting.  Genitourinary: Positive for flank pain and pelvic pain. Negative for decreased urine volume, difficulty urinating, dysuria, frequency, hematuria, vaginal bleeding, vaginal discharge and vaginal pain.  Musculoskeletal: Negative for arthralgias, back pain, gait problem, joint swelling, myalgias, neck pain and neck stiffness.  Skin: Negative for color change, pallor and rash.  Neurological: Negative for dizziness, weakness, light-headedness and headaches.  Psychiatric/Behavioral: Negative for behavioral problems.     Physical Exam Updated Vital Signs BP 123/75 (BP Location: Left Arm)   Pulse (!) 56  Temp 98.3 F (36.8 C) (Oral)   Resp 16   Ht 5\' 2"  (1.575 m)   Wt 81.6 kg (180 lb)   LMP 12/01/1998   SpO2 99%   BMI 32.92 kg/m   Physical Exam  Constitutional: She appears well-developed and well-nourished.  Non-toxic appearance. She does not appear ill. No distress.  Afebrile, non-toxic appearing, sitting comfortably in bed in no acute distress.  HENT:  Head: Normocephalic and atraumatic.  Mouth/Throat: Oropharynx is clear and moist. No oropharyngeal exudate.  Eyes:  Conjunctivae and EOM are normal. Right eye exhibits no discharge. Left eye exhibits no discharge. No scleral icterus.  Neck: Normal range of motion. Neck supple.  Cardiovascular: Normal rate, regular rhythm and normal heart sounds.  Pulmonary/Chest: Effort normal and breath sounds normal. No stridor. No respiratory distress. She has no wheezes. She has no rales. She exhibits no tenderness.  Abdominal: Soft. Normal appearance and bowel sounds are normal. She exhibits no distension and no mass. There is tenderness in the left upper quadrant. There is no rigidity, no rebound, no guarding, no CVA tenderness, no tenderness at McBurney's point and negative Murphy's sign.  LLQ discomfort with deep palpation of the left lower quadrant  Musculoskeletal: Normal range of motion. She exhibits no edema.  Neurological: She is alert.  Skin: Skin is warm and dry. No rash noted. She is not diaphoretic. No erythema. No pallor.  Psychiatric: She has a normal mood and affect. Her behavior is normal.  Nursing note and vitals reviewed.     ED Treatments / Results  Labs (all labs ordered are listed, but only abnormal results are displayed) Labs Reviewed  COMPREHENSIVE METABOLIC PANEL - Abnormal; Notable for the following components:      Result Value   Glucose, Bld 110 (*)    All other components within normal limits  LIPASE, BLOOD  CBC  URINALYSIS, ROUTINE W REFLEX MICROSCOPIC    EKG None  Radiology US Transvaginal Non-ob  Result Date: 02/19/2018 CLINICAL DATA:  Left pelvic pain for 1 month EXAM: TRANSABDOMINAL AND TRANSVAGINAL ULTRASOUND OF PELVIS DOPPLER ULTRASOUND OF OVARIES TECHNIQUE: Both transabdominal and transvaginal ultrasound examinations of the pelvis were performed. Transabdominal technique was performed for global imaging of the pelvis including uterus, ovaries, adnexal regions, and pelvic cul-de-sac. It was necessary to proceed with endovaginal exam following the transabdominal exam to  visualize the adnexa and ovaries. Color and duplex Doppler ultrasound was utilized to evaluate blood flow to the ovaries. COMPARISON:  None. FINDINGS: Uterus Measurements: Surgically absent. Endometrium Thickness: Surgically absent. Right ovary Nonvisualized Left ovary Measurements: 1.6 x 1 x 1.7 cm. Echogenic area in the left ovary with shadowing, measuring 0.7 x 0.6 x 0.7 cm Pulsed Doppler evaluation of the left ovary demonstrates normal low-resistance arterial and venous waveforms. Other findings No abnormal free fluid. IMPRESSION: 1. Status post hysterectomy 2. Negative for left ovarian torsion.  Nonvisualized right ovary 3. Echogenic area in the left ovary, possibly representing small calcified mass. Consider further evaluation with nonemergent pelvic MRI. Electronically Signed   By: Jasmine Pang M.D.   On: 02/19/2018 15:26   US Pelvis Complete  Result Date: 02/19/2018 CLINICAL DATA:  Left pelvic pain for 1 month EXAM: TRANSABDOMINAL AND TRANSVAGINAL ULTRASOUND OF PELVIS DOPPLER ULTRASOUND OF OVARIES TECHNIQUE: Both transabdominal and transvaginal ultrasound examinations of the pelvis were performed. Transabdominal technique was performed for global imaging of the pelvis including uterus, ovaries, adnexal regions, and pelvic cul-de-sac. It was necessary to proceed with endovaginal exam following the transabdominal  exam to visualize the adnexa and ovaries. Color and duplex Doppler ultrasound was utilized to evaluate blood flow to the ovaries. COMPARISON:  None. FINDINGS: Uterus Measurements: Surgically absent. Endometrium Thickness: Surgically absent. Right ovary Nonvisualized Left ovary Measurements: 1.6 x 1 x 1.7 cm. Echogenic area in the left ovary with shadowing, measuring 0.7 x 0.6 x 0.7 cm Pulsed Doppler evaluation of the left ovary demonstrates normal low-resistance arterial and venous waveforms. Other findings No abnormal free fluid. IMPRESSION: 1. Status post hysterectomy 2. Negative for left  ovarian torsion.  Nonvisualized right ovary 3. Echogenic area in the left ovary, possibly representing small calcified mass. Consider further evaluation with nonemergent pelvic MRI. Electronically Signed   By: Jasmine Pang M.D.   On: 02/19/2018 15:26   [place] Art/ven Flow Abd Pelv Doppler  Result Date: 02/19/2018 CLINICAL DATA:  Left pelvic pain for 1 month EXAM: TRANSABDOMINAL AND TRANSVAGINAL ULTRASOUND OF PELVIS DOPPLER ULTRASOUND OF OVARIES TECHNIQUE: Both transabdominal and transvaginal ultrasound examinations of the pelvis were performed. Transabdominal technique was performed for global imaging of the pelvis including uterus, ovaries, adnexal regions, and pelvic cul-de-sac. It was necessary to proceed with endovaginal exam following the transabdominal exam to visualize the adnexa and ovaries. Color and duplex Doppler ultrasound was utilized to evaluate blood flow to the ovaries. COMPARISON:  None. FINDINGS: Uterus Measurements: Surgically absent. Endometrium Thickness: Surgically absent. Right ovary Nonvisualized Left ovary Measurements: 1.6 x 1 x 1.7 cm. Echogenic area in the left ovary with shadowing, measuring 0.7 x 0.6 x 0.7 cm Pulsed Doppler evaluation of the left ovary demonstrates normal low-resistance arterial and venous waveforms. Other findings No abnormal free fluid. IMPRESSION: 1. Status post hysterectomy 2. Negative for left ovarian torsion.  Nonvisualized right ovary 3. Echogenic area in the left ovary, possibly representing small calcified mass. Consider further evaluation with nonemergent pelvic MRI. Electronically Signed   By: Jasmine Pang M.D.   On: 02/19/2018 15:26    Procedures Procedures (including critical care time)  Medications Ordered in ED Medications  ketorolac (TORADOL) injection 30 mg (30 mg Intramuscular Given 02/19/18 1453)     Initial Impression / Assessment and Plan / ED Course  I have reviewed the triage vital signs and the nursing notes.  Pertinent labs &  imaging results that were available during my care of the patient were reviewed by me and considered in my medical decision making (see chart for details).  Clinical Course as of Feb 19 1638  Fri Feb 19, 2018  207 52 year old female with left lower quadrant pain radiating to her left thigh is been going on for a few weeks.  She has had a CAT scan that did not show any obvious etiology.  She is back again with worsening pain.  She is a benign abdominal exam and otherwise looks nontoxic.  Her lab work was unremarkable and she is pending the results of a pelvic ultrasound.  She will likely be discharged and need to follow-up with her primary care doctor for further evaluation.   [MB]    Clinical Course User Index [MB] Terrilee Files, MD   Patient presents with one month of intermittent deep LLQ pain, no hx of constipation, no N/V/D. No fever, chills, urinary SXs.  Patient had full workup with CT abdomen a week ago which was negative. Labs unremarkable. Exam reassuring  Ordered US to r/o torsion or ovarian mass. [place] without acute abnormality. Left ovary with calcification recommending non-emergent MRI for further evaluation outpatient.  Patient's pain was managed  while in the ER. She denied any nausea on my assessment.  Patient is well-appearing, non-toxic afebrile. I do not suspect an emergent etiology to patient's symptoms at this time.  Will dc home with symptomatic relief and close PCP follow up.  Discussed strict return precautions and advised to return to the emergency department if experiencing any new or worsening symptoms. Instructions were understood and patient agreed with discharge plan.  Final Clinical Impressions(s) / ED Diagnoses   Final diagnoses:  LLQ pain    ED Discharge Orders        Ordered    ibuprofen (ADVIL,MOTRIN) 800 MG tablet  3 times daily     02/19/18 1637       Gregary CromerMitchell, Donae Kueker B, PA-C 02/19/18 1740    Terrilee FilesButler, Michael C, MD 02/19/18 [phone]

## 2018-02-19 NOTE — Discharge Instructions (Addendum)
As discussed, follow up with your primary care provider. Drink plenty of fluids and follow a diet high in fruits and vegetables.  Your ultrasound did not show any acute abnormalities. There was a small area of calcification on the left ovary that may require non-urgent MRI outpatient. Follow up with your primary care provider to schedule further imaging outpatient. Ibuprofen for pain and warm compresses.  Return if symptoms worsen or new concerning symptoms in the meantime.

## 2018-02-19 NOTE — ED Triage Notes (Signed)
Patient c/oleft lower abdominal pain, but states she does not know if it is her left hip or her left lower abdomen. Patient also reports that the pain radiates into her let leg. Patient reports that she had a CT scan abdomen on 02/05/18 and states it did not show anything. Patient also c/o nausea.

## 2018-04-12 ENCOUNTER — Other Ambulatory Visit: Payer: Self-pay | Admitting: Obstetrics and Gynecology

## 2018-04-12 DIAGNOSIS — R928 Other abnormal and inconclusive findings on diagnostic imaging of breast: Secondary | ICD-10-CM

## 2018-04-13 ENCOUNTER — Ambulatory Visit
Admission: RE | Admit: 2018-04-13 | Discharge: 2018-04-13 | Disposition: A | Discharge status: Home / Self Care | Payer: BLUE CROSS/BLUE SHIELD | Source: Ambulatory Visit | Attending: Obstetrics and Gynecology | Admitting: Obstetrics and Gynecology

## 2018-04-13 ENCOUNTER — Other Ambulatory Visit: Payer: Self-pay | Admitting: Obstetrics and Gynecology

## 2018-04-13 DIAGNOSIS — R928 Other abnormal and inconclusive findings on diagnostic imaging of breast: Secondary | ICD-10-CM

## 2018-04-13 DIAGNOSIS — N6320 Unspecified lump in the left breast, unspecified quadrant: Secondary | ICD-10-CM

## 2018-05-29 IMAGING — US US ART/VEN ABD/PELV/SCROTUM DOPPLER LTD
1 series · 13 of 25 positions shown · non-contrast
Comparison: None.

CLINICAL DATA: Left pelvic pain for 1 month

EXAM:
TRANSABDOMINAL AND TRANSVAGINAL ULTRASOUND OF PELVIS
DOPPLER ULTRASOUND OF OVARIES
TECHNIQUE: Both transabdominal and transvaginal ultrasound examinations of the
pelvis were performed. Transabdominal technique was performed for
global imaging of the pelvis including uterus, ovaries, adnexal
regions, and pelvic cul-de-sac.
It was necessary to proceed with endovaginal exam following the
transabdominal exam to visualize the adnexa and ovaries. Color and
duplex Doppler ultrasound was utilized to evaluate blood flow to the
ovaries.

[Series 1: us art/ven abd/pelv/scrotum doppler ltd · 0.25mm/px · 60 acquisitions, 13 frames shown]
[im 1/60]
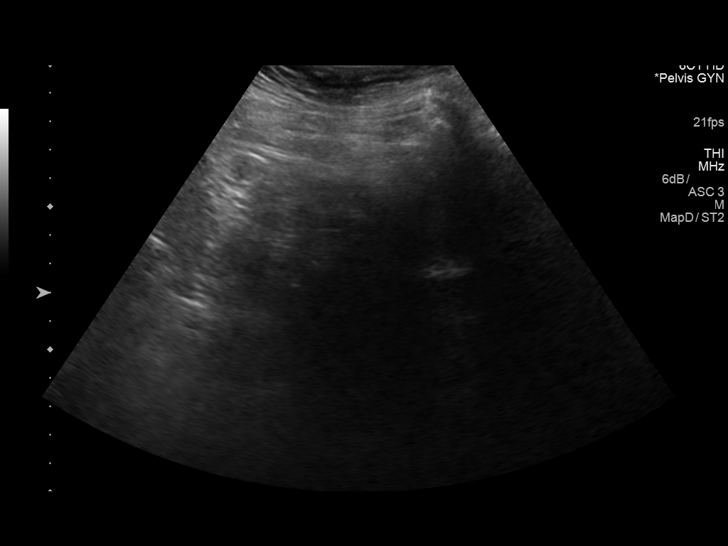
[im 5/60]
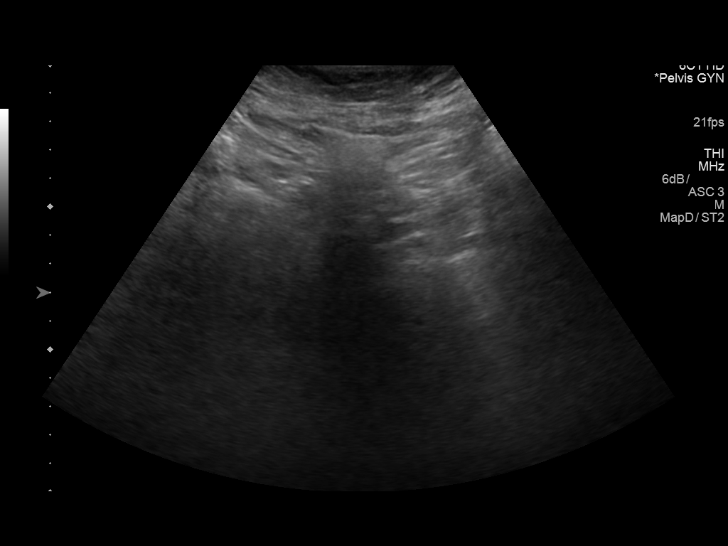
[im 10/60]
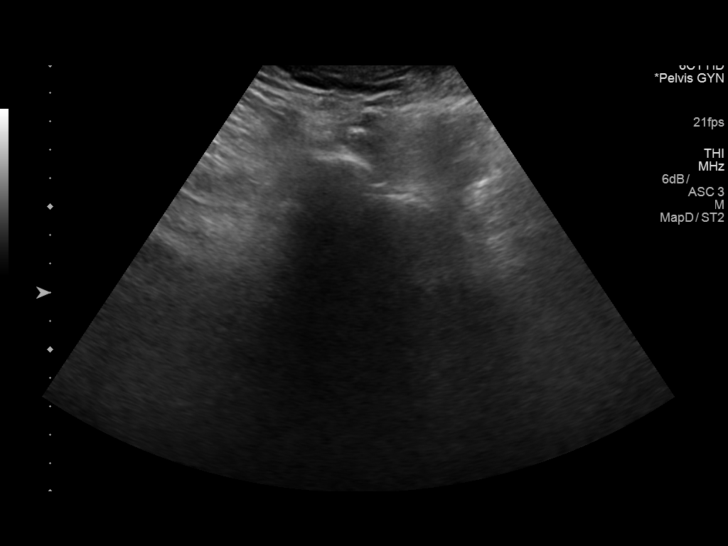
[im 15/60]
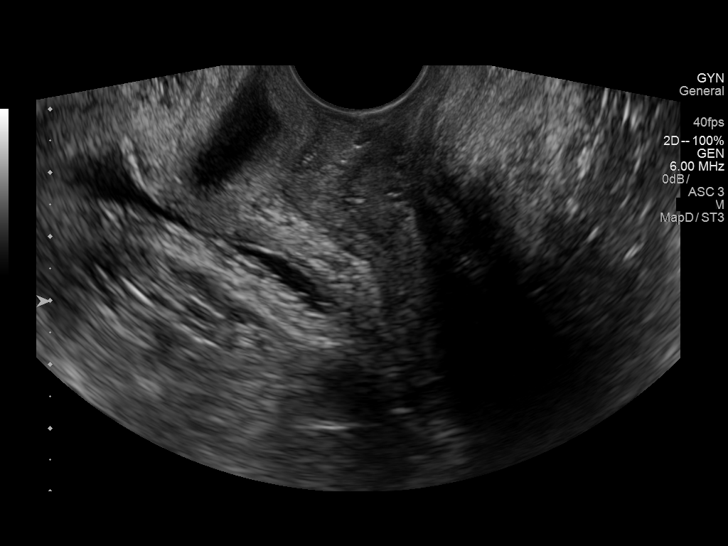
[im 20/60]
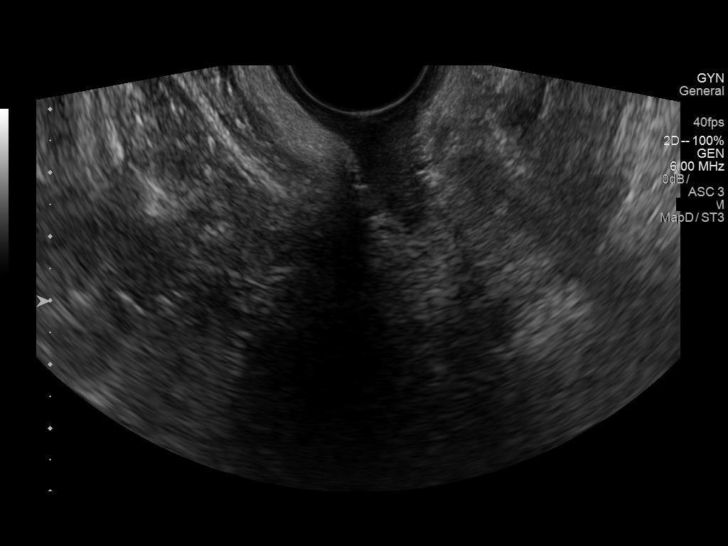
[im 25/60]
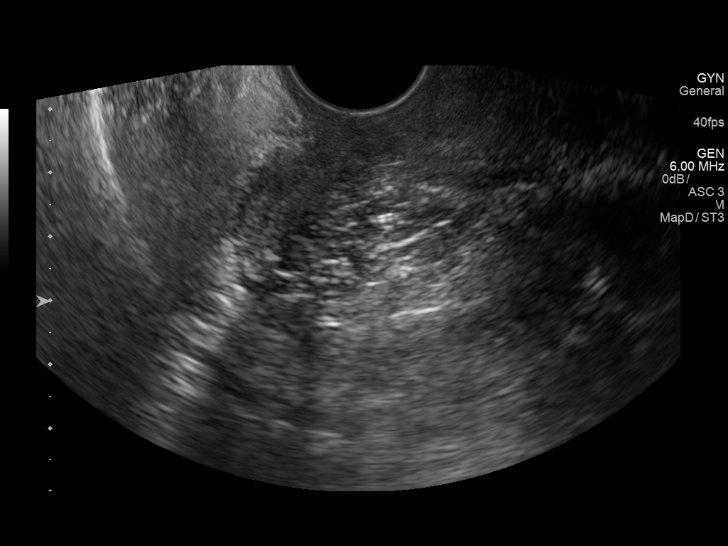
[im 30/60]
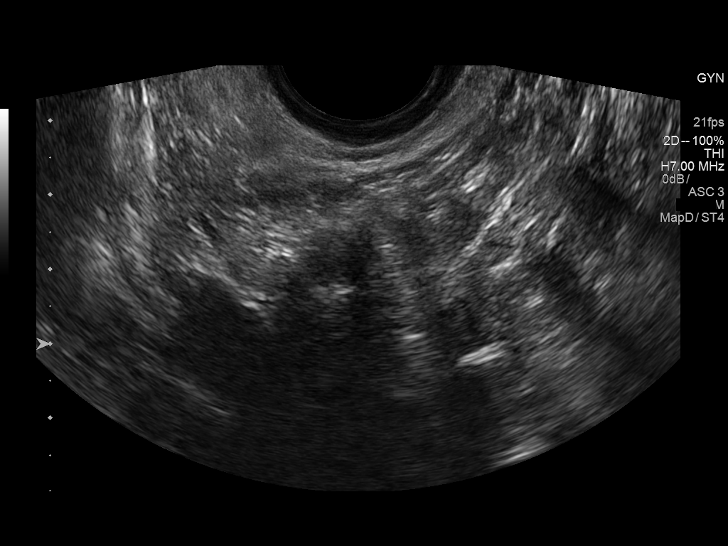
[im 35/60]
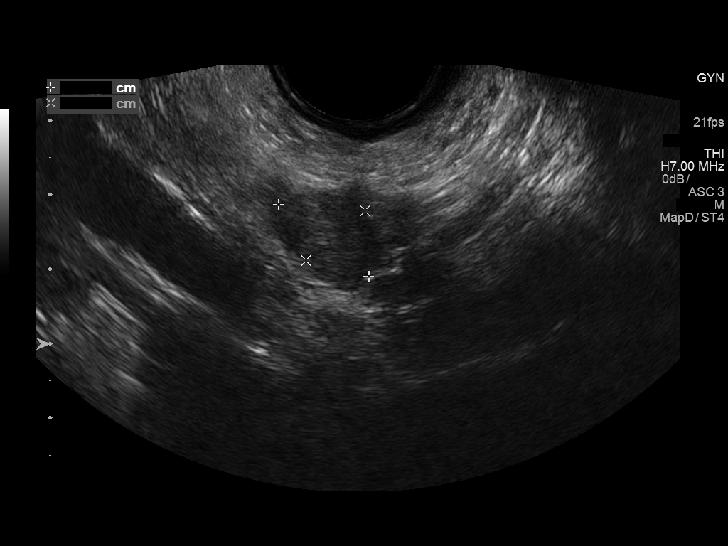
[im 40/60]
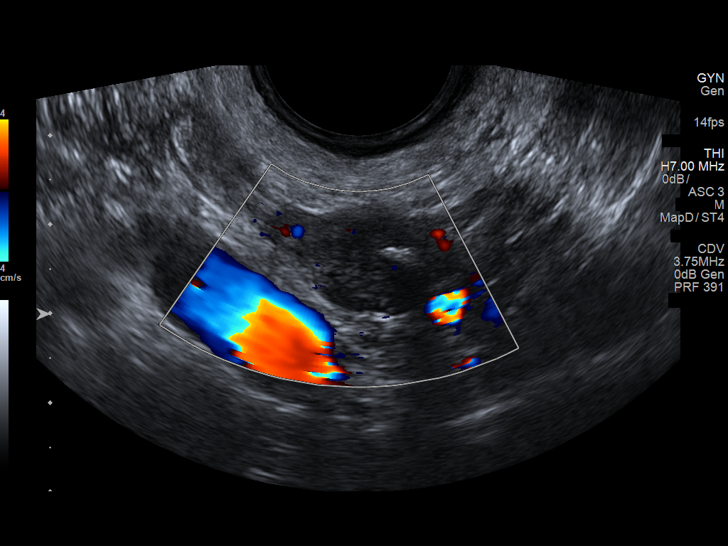
[im 45/60]
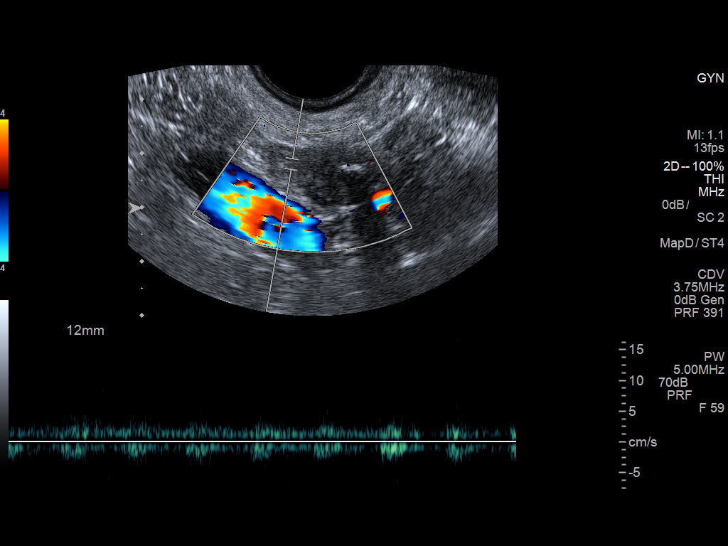
[im 50/60]
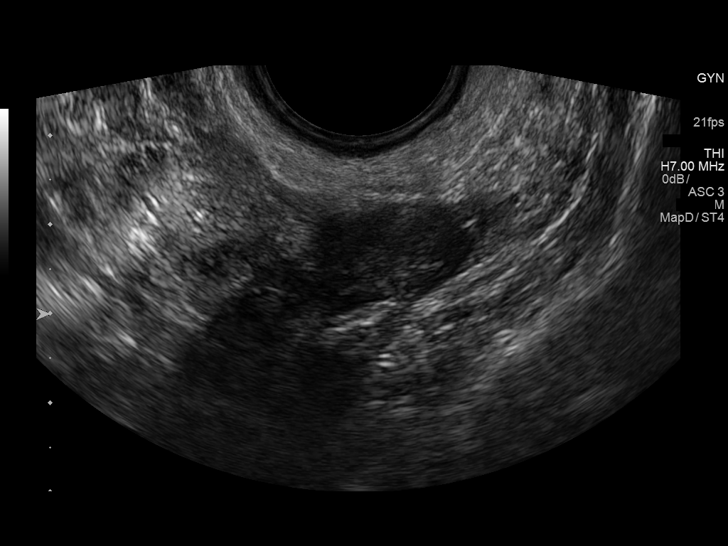
[im 55/60]
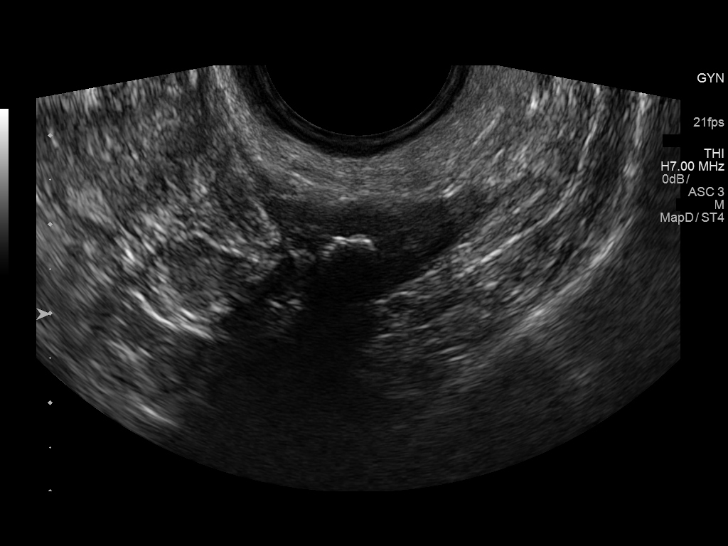
[im 60/60]
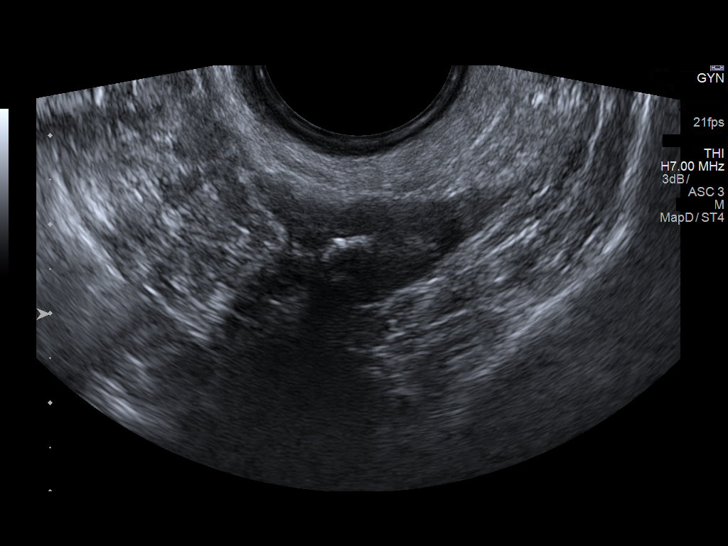

[13 of 25 positions shown; findings below may reference images not displayed]

FINDINGS: Uterus

Measurements: Surgically absent.

Endometrium

Thickness: Surgically absent.

Right ovary

Nonvisualized

Left ovary

Measurements: 1.6 x 1 x 1.7 cm. Echogenic area in the left ovary
with shadowing, measuring 0.7 x 0.6 x 0.7 cm

Pulsed Doppler evaluation of the left ovary demonstrates normal
low-resistance arterial and venous waveforms.

Other findings

No abnormal free fluid.
IMPRESSION: 1. Status post hysterectomy
2. Negative for left ovarian torsion.  Nonvisualized right ovary
3. Echogenic area in the left ovary, possibly representing small
calcified mass. Consider further evaluation with nonemergent pelvic
MRI.

## 2018-10-01 ENCOUNTER — Ambulatory Visit
Admission: RE | Admit: 2018-10-01 | Discharge: 2018-10-01 | Disposition: A | Discharge status: Home / Self Care | Payer: BLUE CROSS/BLUE SHIELD | Source: Ambulatory Visit | Attending: Obstetrics and Gynecology | Admitting: Obstetrics and Gynecology

## 2018-10-01 ENCOUNTER — Other Ambulatory Visit: Payer: Self-pay | Admitting: Obstetrics and Gynecology

## 2018-10-01 DIAGNOSIS — N6320 Unspecified lump in the left breast, unspecified quadrant: Secondary | ICD-10-CM

## 2019-04-04 ENCOUNTER — Other Ambulatory Visit: Payer: BLUE CROSS/BLUE SHIELD

## 2019-04-15 ENCOUNTER — Other Ambulatory Visit: Payer: BLUE CROSS/BLUE SHIELD

## 2019-05-04 ENCOUNTER — Ambulatory Visit
Admission: RE | Admit: 2019-05-04 | Discharge: 2019-05-04 | Disposition: A | Discharge status: Home / Self Care | Payer: BLUE CROSS/BLUE SHIELD | Source: Ambulatory Visit | Attending: Obstetrics and Gynecology | Admitting: Obstetrics and Gynecology

## 2019-05-04 ENCOUNTER — Other Ambulatory Visit: Payer: Self-pay

## 2019-05-04 DIAGNOSIS — N6320 Unspecified lump in the left breast, unspecified quadrant: Secondary | ICD-10-CM

## 2019-10-05 ENCOUNTER — Other Ambulatory Visit: Payer: Self-pay

## 2019-10-05 DIAGNOSIS — Z20822 Contact with and (suspected) exposure to covid-19: Secondary | ICD-10-CM

## 2019-10-06 LAB — NOVEL CORONAVIRUS, NAA: SARS-CoV-2, NAA: NOT DETECTED

## 2020-02-21 ENCOUNTER — Other Ambulatory Visit: Payer: Self-pay | Admitting: Internal Medicine

## 2020-02-21 DIAGNOSIS — R109 Unspecified abdominal pain: Secondary | ICD-10-CM

## 2020-02-22 ENCOUNTER — Ambulatory Visit
Admission: RE | Admit: 2020-02-22 | Discharge: 2020-02-22 | Disposition: A | Discharge status: Home / Self Care | Payer: BLUE CROSS/BLUE SHIELD | Source: Ambulatory Visit | Attending: Internal Medicine | Admitting: Internal Medicine

## 2020-02-22 DIAGNOSIS — R109 Unspecified abdominal pain: Secondary | ICD-10-CM

## 2020-02-26 ENCOUNTER — Encounter (HOSPITAL_COMMUNITY): Payer: Self-pay | Admitting: Emergency Medicine

## 2020-02-26 ENCOUNTER — Emergency Department (HOSPITAL_COMMUNITY)
Admission: EM | Admit: 2020-02-26 | Discharge: 2020-02-27 | Disposition: A | Discharge status: Home / Self Care | Payer: BC Managed Care – PPO | Attending: Emergency Medicine | Admitting: Emergency Medicine

## 2020-02-26 ENCOUNTER — Other Ambulatory Visit: Payer: Self-pay

## 2020-02-26 DIAGNOSIS — R110 Nausea: Secondary | ICD-10-CM | POA: Insufficient documentation

## 2020-02-26 DIAGNOSIS — R42 Dizziness and giddiness: Secondary | ICD-10-CM | POA: Insufficient documentation

## 2020-02-26 DIAGNOSIS — R55 Syncope and collapse: Secondary | ICD-10-CM | POA: Diagnosis not present

## 2020-02-26 DIAGNOSIS — Z79899 Other long term (current) drug therapy: Secondary | ICD-10-CM | POA: Insufficient documentation

## 2020-02-26 LAB — BASIC METABOLIC PANEL
Anion gap: 10 (ref 5–15)
BUN: 15 mg/dL (ref 6–20)
CO2: 22 mmol/L (ref 22–32)
Calcium: 8.9 mg/dL (ref 8.9–10.3)
Chloride: 106 mmol/L (ref 98–111)
Creatinine, Ser: 0.81 mg/dL (ref 0.44–1.00)
GFR calc Af Amer: >60 mL/min (ref >60)
GFR calc non Af Amer: >60 mL/min (ref >60)
Glucose, Bld: 105 mg/dL — ABNORMAL HIGH (ref 70–99)
Potassium: 3.7 mmol/L (ref 3.5–5.1)
Sodium: 138 mmol/L (ref 135–145)

## 2020-02-26 LAB — CBG MONITORING, ED: Glucose-Capillary: 121 mg/dL — ABNORMAL HIGH (ref 70–99)

## 2020-02-26 LAB — URINALYSIS, ROUTINE W REFLEX MICROSCOPIC
Bilirubin Urine: NEGATIVE
Glucose, UA: NEGATIVE mg/dL
Hgb urine dipstick: NEGATIVE
Ketones, ur: NEGATIVE mg/dL
Leukocytes,Ua: NEGATIVE
Nitrite: NEGATIVE
Protein, ur: NEGATIVE mg/dL
Specific Gravity, Urine: 1.005 (ref 1.005–1.030)
pH: 6 (ref 5.0–8.0)

## 2020-02-26 LAB — HEPATIC FUNCTION PANEL
ALT: 17 U/L (ref 0–44)
AST: 24 U/L (ref 15–41)
Albumin: 4.3 g/dL (ref 3.5–5.0)
Alkaline Phosphatase: 63 U/L (ref 38–126)
Bilirubin, Direct: 0.1 mg/dL (ref 0.0–0.2)
Indirect Bilirubin: 0.3 mg/dL (ref 0.3–0.9)
Total Bilirubin: 0.4 mg/dL (ref 0.3–1.2)
Total Protein: 7.4 g/dL (ref 6.5–8.1)

## 2020-02-26 LAB — CBC
HCT: 38.3 % (ref 36.0–46.0)
Hemoglobin: 12.3 g/dL (ref 12.0–15.0)
MCH: 28 pg (ref 26.0–34.0)
MCHC: 32.1 g/dL (ref 30.0–36.0)
MCV: 87 fL (ref 80.0–100.0)
Platelets: 303 10*3/uL (ref 150–400)
RBC: 4.4 MIL/uL (ref 3.87–5.11)
RDW: 13 % (ref 11.5–15.5)
WBC: 5 10*3/uL (ref 4.0–10.5)
nRBC: 0 % (ref 0.0–0.2)

## 2020-02-26 LAB — LIPASE, BLOOD: Lipase: 26 U/L (ref 11–51)

## 2020-02-26 LAB — TROPONIN I (HIGH SENSITIVITY): Troponin I (High Sensitivity): 2 ng/L (ref <18)

## 2020-02-26 MED ORDER — SODIUM CHLORIDE 0.9% FLUSH: 3.0000 mL | Freq: Once | INTRAVENOUS | Status: DC

## 2020-02-26 NOTE — ED Provider Notes (Signed)
[facility] DEPT Provider Note   CSN: [id number] Arrival date & time: 02/26/20  2138     History Chief Complaint  Patient presents with  . Dizziness  . Nausea    Shelby Jensen is a 54 y.o. female.  HPI 54 y.o. female with complaints/comments per nursing/medical assistant note, with all such history reviewed for accuracy and confirmed by myself.  The patient's relevant past medical, surgical and social history was reviewed in [place].  HPI Comments: Shelby Jensen is a 54 y.o. female with history of hyperlipidemia and GERD who presents to the Emergency Department complaining of near syncopal episodes.  Patient states that 2 days ago she was talking with friends she experienced an episode which she describes as near syncopal with associated diaphoresis, "swimmy headed feeling ", lip and tongue numbness, urge to defecate, nausea, and heaviness in the chest.  She states these episodes last 10 to 15 minutes.  She experienced another episode about 3 hours ago which she states was "not as severe as the last one".  She is not a regular drinker and denies any recent excessive alcohol consumption.  She does not smoke cigarettes. She does smoke marijuana daily for "stress" but denies any other drug use.  She denies any history of generalized anxiety or panic attacks.  She denies any fevers, chills, URI symptoms, chest pain, shortness of breath, abdominal pain, vomiting, diarrhea, urinary changes, dizziness.    Past Medical History:  Diagnosis Date  . Allergic rhinitis due to pollen   . Allergy   . Arthritis   . Diaphragmatic hernia without mention of obstruction or gangrene   . Hyperlipidemia   . Pain in joint, pelvic region and thigh   . Pain in limb   . Syncope and collapse   . Unspecified vitamin D deficiency     Patient Active Problem List   Diagnosis Date Noted  . Mild obesity 01/28/2013  . Situational stress 01/28/2013    Past  Surgical History:  Procedure Laterality Date  . CESAREAN SECTION    . TUBAL LIGATION       OB History   No obstetric history on file.     Family History  Problem Relation Age of Onset  . Cancer Mother   . Heart disease Father     Social History   Tobacco Use  . Smoking status: Never Smoker  . Smokeless tobacco: Never Used  Substance Use Topics  . Alcohol use: No  . Drug use: No    Home Medications Prior to Admission medications   Medication Sig Start Date End Date Taking? Authorizing Provider  Cholecalciferol (MAXIMUM D3) 10000 units CAPS Take 10,000 Units by mouth daily.     [provider]  ibuprofen (ADVIL,MOTRIN) 800 MG tablet Take 1 tablet (800 mg total) by mouth 3 (three) times daily. 02/19/18   Avie Echevaria B, PA-C  IRON PO Take 1 tablet by mouth daily.    [provider]  naproxen sodium (ALEVE) 220 MG tablet Take 440 mg by mouth 2 (two) times daily as needed (pain).    [provider]    Allergies    Codeine, Darvocet [propoxyphene n-acetaminophen], Other, and Tramadol  Review of Systems   Review of Systems  Constitutional: Positive for diaphoresis and fatigue. Negative for chills and fever.  HENT: Negative for congestion and rhinorrhea.   Respiratory: Negative for shortness of breath.   Cardiovascular: Negative for chest pain.  Gastrointestinal: Positive for nausea. Negative for  abdominal pain, diarrhea and vomiting.  Genitourinary: Negative for difficulty urinating, dysuria and hematuria.  Skin: Negative for color change and wound.  Neurological: Positive for syncope, weakness, light-headedness, numbness and headaches. Negative for dizziness, seizures and facial asymmetry.  All other systems reviewed and are negative.  Physical Exam Updated Vital Signs BP (!) 164/80 (BP Location: Left Arm)   Pulse 80   Temp 98.7 F (37.1 C) (Oral)   Resp 16   Ht 5\' 1"  (1.549 m)   Wt 72.1 kg   LMP 12/01/1998   SpO2 100%   BMI 30.04  kg/m   Physical Exam Vitals and nursing note reviewed.  Constitutional:      General: She is not in acute distress.    Appearance: Normal appearance. She is normal weight. She is not ill-appearing, toxic-appearing or diaphoretic.     Comments: Pleasant well-developed African-American female sitting upright and speaking clearly and coherently.  HENT:     Head: Normocephalic and atraumatic.     Nose: Nose normal.     Mouth/Throat:     Mouth: Mucous membranes are moist.     Pharynx: Oropharynx is clear. No oropharyngeal exudate or posterior oropharyngeal erythema.  Eyes:     General: No scleral icterus.       Right eye: No discharge.        Left eye: No discharge.     Extraocular Movements: Extraocular movements intact.     Pupils: Pupils are equal, round, and reactive to light.  Cardiovascular:     Rate and Rhythm: Normal rate and regular rhythm.     Pulses: Normal pulses.     Heart sounds: Normal heart sounds. No murmur. No friction rub. No gallop.   Pulmonary:     Effort: Pulmonary effort is normal. No respiratory distress.     Breath sounds: Normal breath sounds. No stridor. No wheezing, rhonchi or rales.  Abdominal:     General: Abdomen is flat.     Palpations: Abdomen is soft.     Tenderness: There is no abdominal tenderness.  Musculoskeletal:        General: Normal range of motion.     Cervical back: Normal range of motion. No tenderness.  Skin:    General: Skin is warm and dry.     Capillary Refill: Capillary refill takes less than 2 seconds.  Neurological:     General: No focal deficit present.     Mental Status: She is alert and oriented to person, place, and time.     Cranial Nerves: No cranial nerve deficit.     Sensory: No sensory deficit.     Motor: No weakness.     Coordination: Coordination normal.     Gait: Gait normal.     Comments: Distal sensation intact in the bilateral upper and lower extremities.  Strength is 5 out of 5 in the bilateral upper and  lower extremities.  Good finger-to-nose noted bilaterally without ataxia.  Negative Romberg.  Psychiatric:        Mood and Affect: Mood normal.        Behavior: Behavior normal.    ED Results / Procedures / Treatments   Labs (all labs ordered are listed, but only abnormal results are displayed) Labs Reviewed  BASIC METABOLIC PANEL - Abnormal; Notable for the following components:      Result Value   Glucose, Bld 105 (*)    All other components within normal limits  URINALYSIS, ROUTINE W REFLEX MICROSCOPIC - Abnormal; Notable  for the following components:   Color, Urine STRAW (*)    All other components within normal limits  CBG MONITORING, ED - Abnormal; Notable for the following components:   Glucose-Capillary 121 (*)    All other components within normal limits  CBC  HEPATIC FUNCTION PANEL  LIPASE, BLOOD  TSH  T4, FREE  BRAIN NATRIURETIC PEPTIDE  TROPONIN I (HIGH SENSITIVITY)  TROPONIN I (HIGH SENSITIVITY)   ED ECG REPORT   Date: 02/27/2020  Rate: 70    Rhythm: normal sinus rhythm  QRS Axis: normal  Intervals: normal  ST/T Wave abnormalities: normal  Conduction Disutrbances:none  Narrative Interpretation:   Old EKG Reviewed: none available  I have personally reviewed the EKG tracing and agree with the computerized printout as noted.  Radiology No results found.  Procedures Procedures   Medications Ordered in ED Medications  sodium chloride flush (NS) 0.9 % injection 3 mL (0 mLs Intravenous Hold 02/26/20 2209)   ED Course  I have reviewed the triage vital signs and the nursing notes.  Pertinent labs & imaging results that were available during my care of the patient were reviewed by me and considered in my medical decision making (see chart for details).    MDM Rules/Calculators/A&P                      11:11 PM patient is a 54 year old African-American female that presents with 2 near syncopal episodes with the most recent occurring a few hours ago.  Her  physical exam is reassuring and her neurological exam is benign.  Awaiting basic labs.  We will also order TSH and free T4.  Besides her age, her PERC criteria is negative.  Wells criteria for PE DVT rule out is 0.  Will closely monitor and reevaluate.  1:37 AM orthostatic vital signs ordered and are nonconcerning.  Additionally initial labs are reassuring.  Negative troponin.  Negative TSH. Pt was discussed with and evaluated by my attending physician Dr. Glynn Octave who agrees with the above plan. She is followed by Dr. Sharyn Lull. If second troponin is negative will d/c with outpatient cardiology follow up.  2:06 AM second troponin is negative.  Discussed patient's lab results with patient and her husband.  She mentioned that she actually has an appointment with Dr. Sharyn Lull later this week and I urged her to make sure to keep this appointment to follow-up.  Her questions were answered.  Strict return precautions given.  Vital signs stable.  She was amicable at the time of discharge.  Patient discharged to home/self care.  Condition at discharge: Stable  Note: Portions of this report may have been transcribed using voice recognition software. Every effort was made to ensure accuracy; however, inadvertent computerized transcription errors may be present.    Final Clinical Impression(s) / ED Diagnoses Final diagnoses:  Near syncope    Rx / DC Orders ED Discharge Orders    None       Placido Sou, PA-C 02/27/20 0211    Glynn Octave, MD 03/02/20 [phone]

## 2020-02-26 NOTE — ED Triage Notes (Signed)
Patient complaining of episodes dizziness, heart racing, sweating, nausea, and feels like she is going to pass out. Patient states this started Friday.

## 2020-02-27 LAB — T4, FREE: Free T4: 0.83 ng/dL (ref 0.61–1.12)

## 2020-02-27 LAB — TSH: TSH: 0.711 u[IU]/mL (ref 0.350–4.500)

## 2020-02-27 LAB — TROPONIN I (HIGH SENSITIVITY): Troponin I (High Sensitivity): 3 ng/L (ref <18)

## 2020-02-27 NOTE — ED Provider Notes (Signed)
ED ECG REPORT   Date: 02/27/2020  Rate: 70  Rhythm: normal sinus rhythm  QRS Axis: normal  Intervals: normal  ST/T Wave abnormalities: normal  Conduction Disutrbances:none  Narrative Interpretation: no Brugada, no prolonged QT  Old EKG Reviewed: none available  I have personally reviewed the EKG tracing and agree with the computerized printout as noted.    Glynn Octave, MD 02/27/20 [phone]

## 2020-02-27 NOTE — Discharge Instructions (Addendum)
It was a pleasure meeting you.  Please make sure to follow-up with your cardiologist as soon as possible to discuss this visit as well as your symptoms.  Please do not hesitate to return to the emergency department with any new or worsening symptoms.

## 2020-05-23 IMAGING — US ULTRASOUND LEFT BREAST LIMITED
1 series · 7 of 7 positions shown · non-contrast
Comparison: Previous exam(s).

CLINICAL DATA: 52-year-old female for 1 year follow-up of LEFT
breast mass and for annual bilateral mammogram.

EXAM:
DIGITAL DIAGNOSTIC BILATERAL MAMMOGRAM WITH CAD AND TOMO
ULTRASOUND LEFT BREAST

[Series 1: ultrasound left breast limited · 0.06mm/px · 7 of 7 slices shown]
[im 1/7]
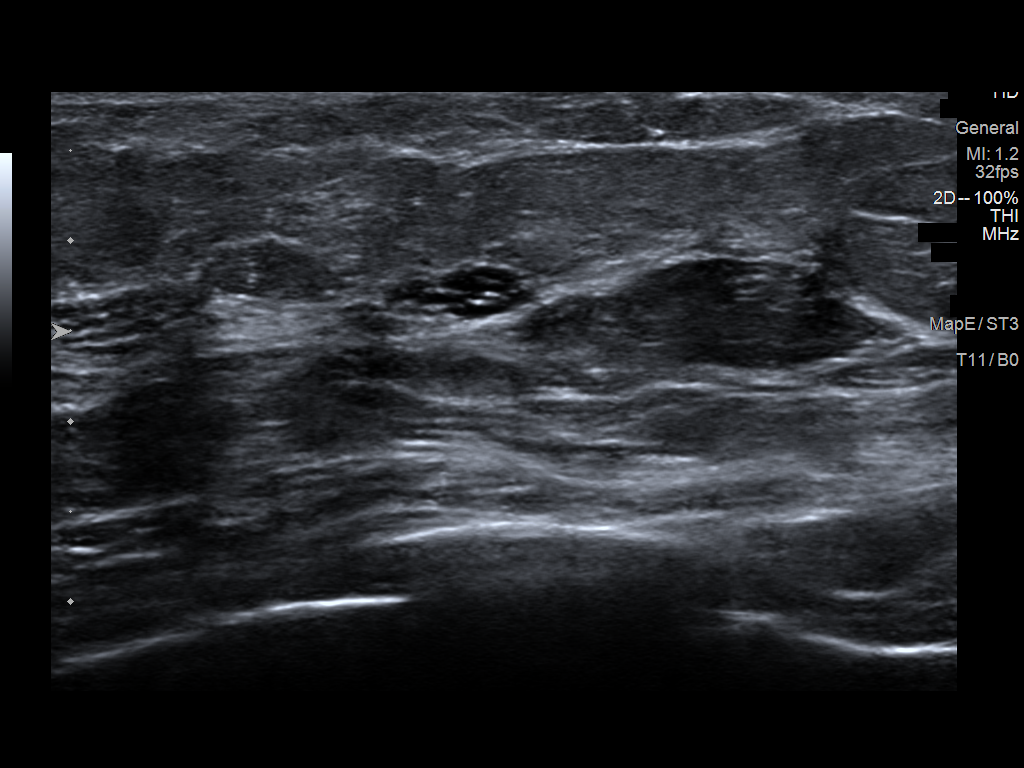
[im 2/7]
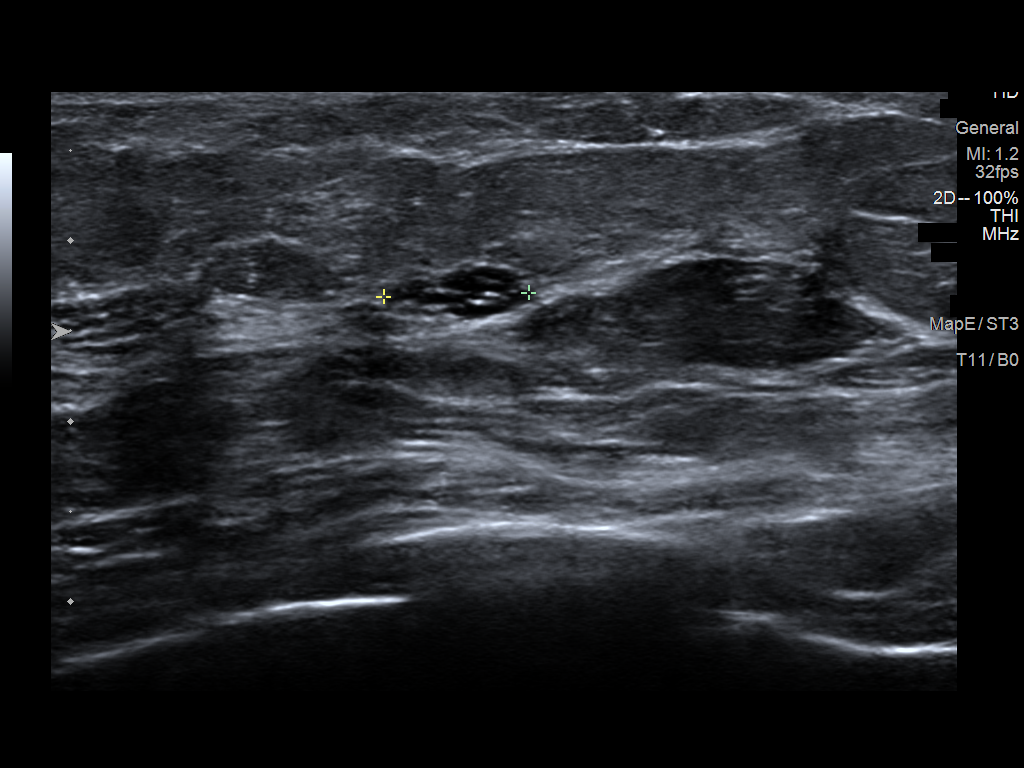
[im 3/7]
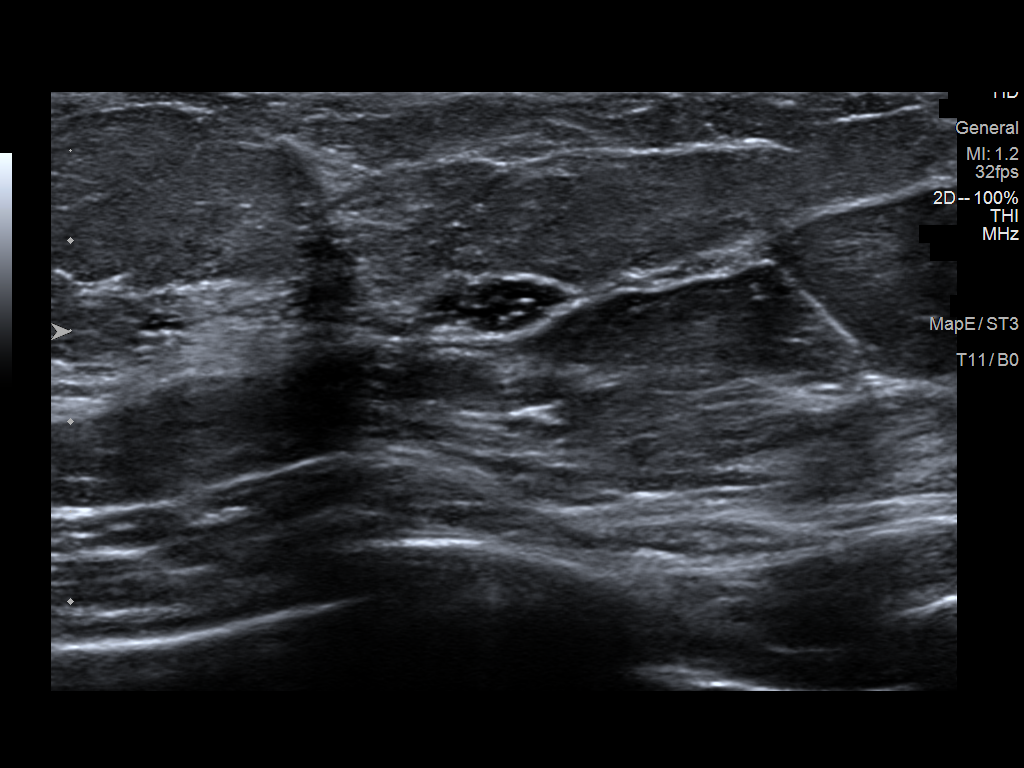
[im 4/7]
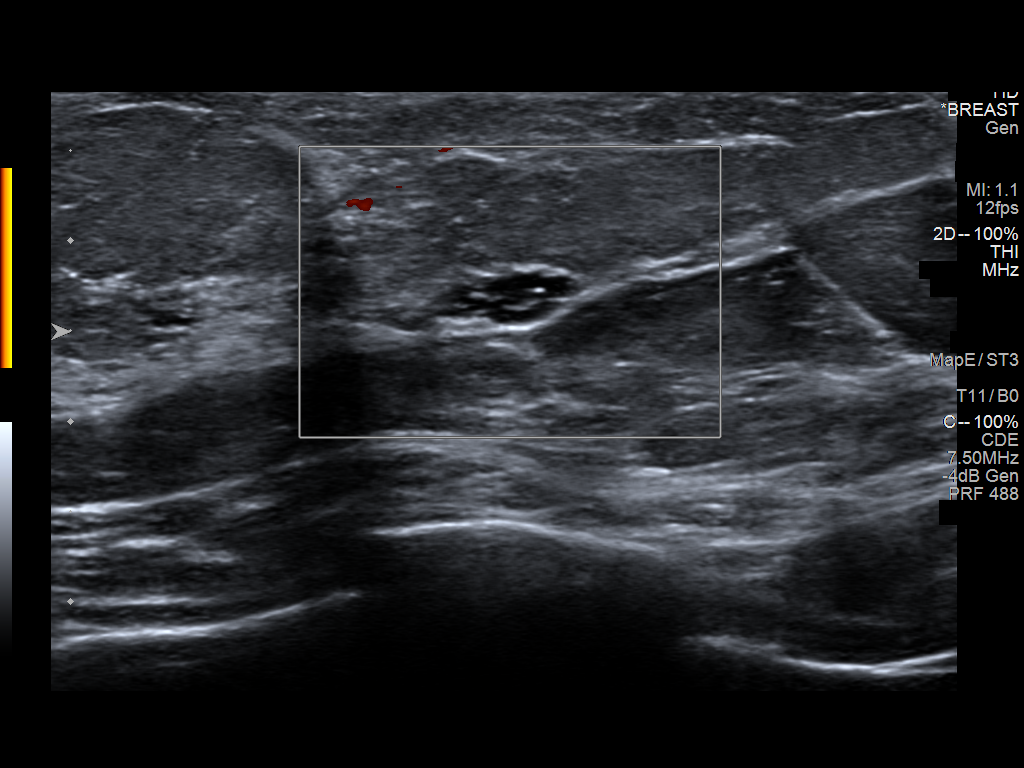
[im 5/7]
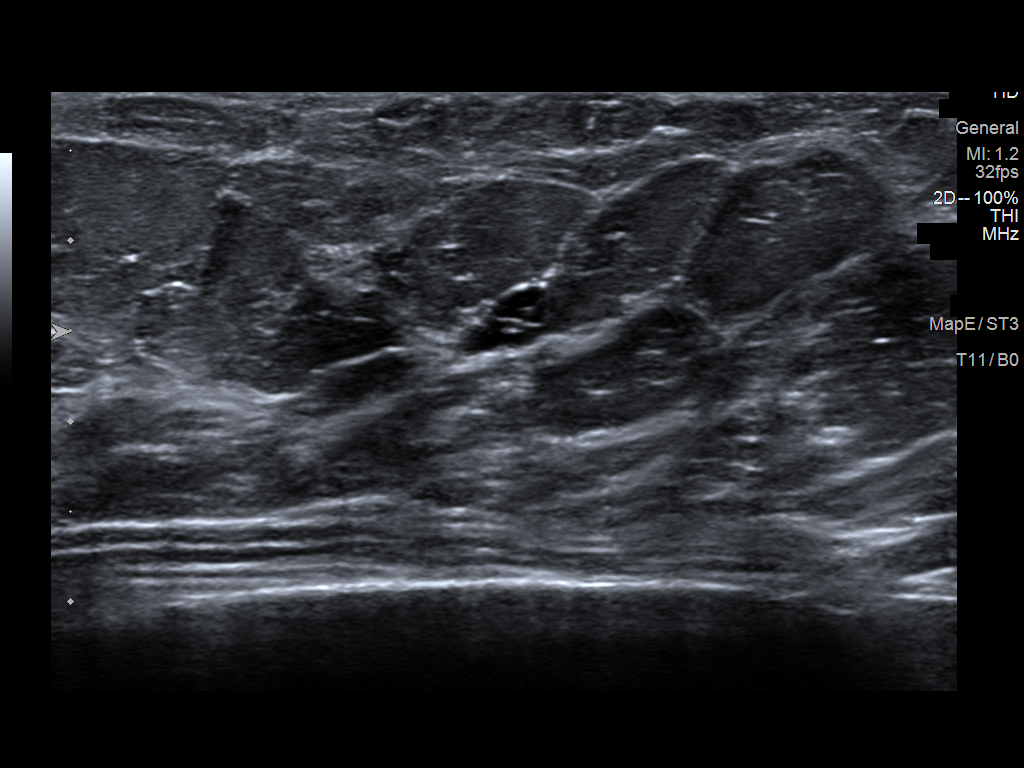
[im 6/7]
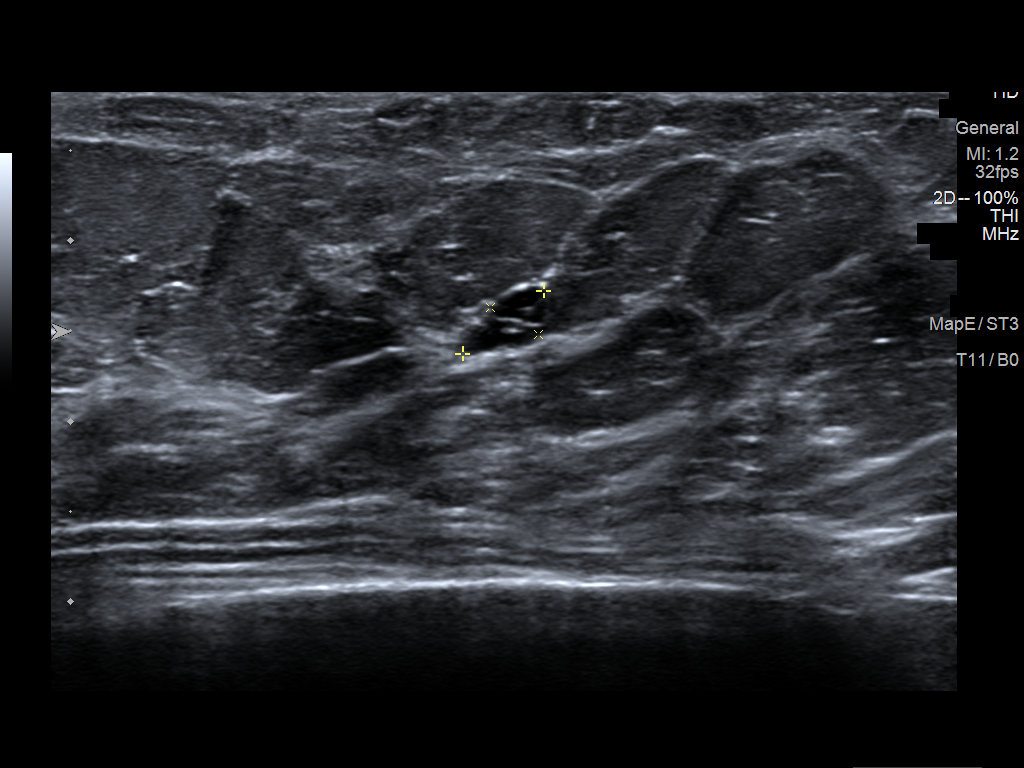
[im 7/7]
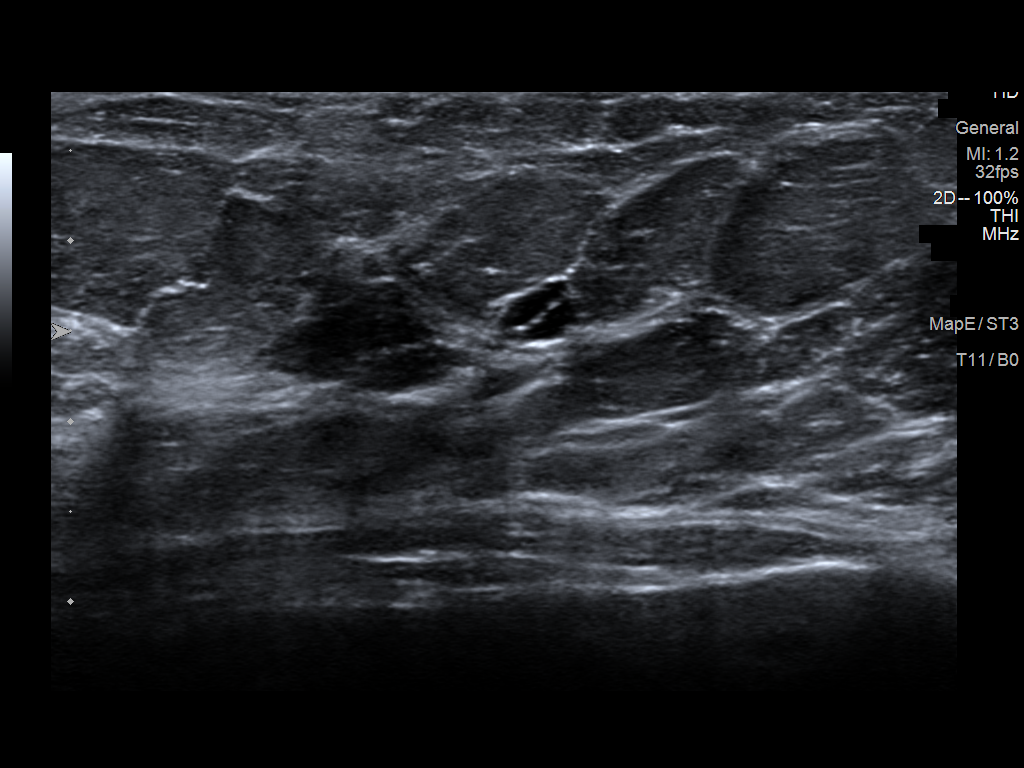

[7 of 7 positions shown; findings below may reference images not displayed]

ACR Breast Density Category c: The breast tissue is heterogeneously
dense, which may obscure small masses.
FINDINGS: 2D/3D full field views of both breasts are performed.

The OUTER LEFT breast mass identified on the prior study is slightly
less conspicuous today.

No new mass, distortion or worrisome calcifications are noted within
either breast.

Mammographic images were processed with CAD.

Targeted ultrasound is performed, showing a stable 0.6 x 0.3 x
cm area of apocrine metaplasia at the 5 o'clock position of the LEFT
breast 2 cm from the nipple.
IMPRESSION: 1. Stable benign apocrine metaplasia in the LOWER OUTER LEFT breast.
No further imaging follow-up recommended.
2. No mammographic evidence of breast malignancy otherwise.

RECOMMENDATION:
Bilateral screening mammogram in 1 year

I have discussed the findings and recommendations with the patient.
Results were also provided in writing at the conclusion of the
visit. If applicable, a reminder letter will be sent to the patient
regarding the next appointment.

BI-RADS CATEGORY  2: Benign.

## 2020-07-09 ENCOUNTER — Encounter (HOSPITAL_BASED_OUTPATIENT_CLINIC_OR_DEPARTMENT_OTHER): Payer: Self-pay | Admitting: *Deleted

## 2020-07-09 ENCOUNTER — Other Ambulatory Visit: Payer: Self-pay

## 2020-07-09 DIAGNOSIS — F410 Panic disorder [episodic paroxysmal anxiety] without agoraphobia: Secondary | ICD-10-CM | POA: Diagnosis not present

## 2020-07-09 DIAGNOSIS — R002 Palpitations: Secondary | ICD-10-CM | POA: Diagnosis not present

## 2020-07-09 NOTE — ED Triage Notes (Signed)
Palpiations. States her breathing is shallow. She appears anxious.

## 2020-07-10 ENCOUNTER — Emergency Department (HOSPITAL_BASED_OUTPATIENT_CLINIC_OR_DEPARTMENT_OTHER)
Admission: EM | Admit: 2020-07-10 | Discharge: 2020-07-10 | Disposition: A | Discharge status: Home / Self Care | Payer: BC Managed Care – PPO | Attending: Emergency Medicine | Admitting: Emergency Medicine

## 2020-07-10 DIAGNOSIS — F410 Panic disorder [episodic paroxysmal anxiety] without agoraphobia: Secondary | ICD-10-CM

## 2020-07-10 DIAGNOSIS — R002 Palpitations: Secondary | ICD-10-CM

## 2020-07-10 MED ORDER — HYDROXYZINE HCL 25 MG PO TABS: 25.0000 mg | ORAL_TABLET | Freq: Four times a day (QID) | ORAL | 0 refills | Status: AC | PRN

## 2020-07-10 NOTE — Discharge Instructions (Signed)
We advise follow-up with your primary care doctor.  You may use Atarax as prescribed for management of increased stress/anxiety.  Do not drink or drive after taking this medication as it may make you drowsy and impair your judgment.  You may refer to the resource guide for local counseling services.

## 2020-07-10 NOTE — ED Notes (Signed)
Pt states anxiety/heart racing that began today, does not know what caused the anxiety and states she has had anxiety attacks in the past, last one being in march, states increased life stressors. She also states she feel somewhat better since being here.

## 2020-07-10 NOTE — ED Provider Notes (Signed)
[facility] EMERGENCY DEPARTMENT Provider Note   CSN: [id number] Arrival date & time: 07/09/20  2037     History Chief Complaint  Patient presents with  . Panic Attack    Shelby Jensen is a 54 y.o. female.   54 year old female presents to the emergency department for evaluation of palpitations.  She classifies her palpitations as her heart beating rapidly.  This began after getting out of the shower this morning.  Her symptoms were associated with, what she would consider, "shallow breathing".  Reports that her symptoms have spontaneously subsided.  No medications taken prior to arrival.  She does have a history of anxiety.  Was referred to cardiology for palpitations earlier this year and had a 1 month Holter monitor completed which was unremarkable.  Endorses increased life stressors with separation from her husband.  No paresthesias, extremity weakness, syncope or near syncope, nausea or vomiting.  The history is provided by the patient. No language interpreter was used.       Past Medical History:  Diagnosis Date  . Allergic rhinitis due to pollen   . Allergy   . Arthritis   . Diaphragmatic hernia without mention of obstruction or gangrene   . Hyperlipidemia   . Pain in joint, pelvic region and thigh   . Pain in limb   . Syncope and collapse   . Unspecified vitamin D deficiency     Patient Active Problem List   Diagnosis Date Noted  . Mild obesity 01/28/2013  . Situational stress 01/28/2013    Past Surgical History:  Procedure Laterality Date  . CESAREAN SECTION    . TUBAL LIGATION       OB History   No obstetric history on file.     Family History  Problem Relation Age of Onset  . Cancer Mother   . Heart disease Father     Social History   Tobacco Use  . Smoking status: Never Smoker  . Smokeless tobacco: Never Used  Substance Use Topics  . Alcohol use: No  . Drug use: No    Home Medications Prior to Admission medications     Medication Sig Start Date End Date Taking? Authorizing Provider  atorvastatin (LIPITOR) 20 MG tablet Take 20 mg by mouth daily. 02/20/20  Yes [provider]  Cholecalciferol (MAXIMUM D3) 10000 units CAPS Take 10,000 Units by mouth daily.    Yes [provider]  IRON PO Take 1 tablet by mouth daily.   Yes [provider]  Multiple Vitamin (MULTIVITAMIN ADULT PO) Take 1 tablet by mouth daily.   Yes [provider]  pantoprazole (PROTONIX) 40 MG tablet Take 40 mg by mouth daily. 01/18/20  Yes [provider]  hydrOXYzine (ATARAX/VISTARIL) 25 MG tablet Take 1 tablet (25 mg total) by mouth every 6 (six) hours as needed for anxiety. 07/10/20   Antony Madura, PA-C    Allergies    Codeine, Darvocet [propoxyphene n-acetaminophen], Other, and Tramadol  Review of Systems   Review of Systems  Ten systems reviewed and are negative for acute change, except as noted in the HPI.    Physical Exam Updated Vital Signs BP (!) 135/101 (BP Location: Right Arm)   Pulse 67   Temp 98.4 F (36.9 C) (Oral)   Resp 17   Ht 5\' 1"  (1.549 m)   Wt 69.4 kg   LMP 12/01/1998   SpO2 99%   BMI 28.91 kg/m   Physical Exam Vitals and nursing note reviewed.  Constitutional:      General: She is not in acute distress.    Appearance: She is well-developed. She is not diaphoretic.     Comments: Nontoxic appearing and in NAD  HENT:     Head: Normocephalic and atraumatic.  Eyes:     General: No scleral icterus.    Conjunctiva/sclera: Conjunctivae normal.  Cardiovascular:     Rate and Rhythm: Normal rate and regular rhythm.     Pulses: Normal pulses.  Pulmonary:     Effort: Pulmonary effort is normal. No respiratory distress.     Breath sounds: No stridor. No wheezing, rhonchi or rales.     Comments: Lungs CTAB. Respirations even and unlabored. Musculoskeletal:        General: Normal range of motion.     Cervical back: Normal range of motion.  Skin:    General: Skin  is warm and dry.     Coloration: Skin is not pale.     Findings: No erythema or rash.  Neurological:     Mental Status: She is alert and oriented to person, place, and time.  Psychiatric:        Behavior: Behavior normal.     ED Results / Procedures / Treatments   Labs (all labs ordered are listed, but only abnormal results are displayed) Labs Reviewed - No data to display  EKG EKG Interpretation  Date/Time:  Monday July 09 2020 20:55:55 EDT Ventricular Rate:  72 PR Interval:  140 QRS Duration: 84 QT Interval:  386 QTC Calculation: 422 R Axis:   60 Text Interpretation: Normal sinus rhythm with sinus arrhythmia Normal ECG Confirmed by Ross Marcus ([id number]) on 07/10/2020 1:42:04 AM   Radiology No results found.  Procedures Procedures (including critical care time)  Medications Ordered in ED Medications - No data to display   ED Course  I have reviewed the triage vital signs and the nursing notes.  Pertinent labs & imaging results that were available during my care of the patient were reviewed by me and considered in my medical decision making (see chart for details).    MDM Rules/Calculators/A&P                          54 year old female presents to the emergency department for palpitations.  Her symptoms have spontaneously subsided.  Her history is consistent with a panic attack.  She has had a history of palpitations and anxiety attacks.  Was referred to cardiology by her primary care doctor and had a 30-day Holter monitor completed which was unremarkable.  Her EKG today shows normal sinus rhythm.  No signs of acute ischemia.  Suspect panic attack as cause of symptoms tonight.  Will discharge with instruction for primary care follow-up.  Return precautions discussed and provided. Patient discharged in stable condition with no unaddressed concerns.   Final Clinical Impression(s) / ED Diagnoses Final diagnoses:  Palpitations  Panic attack as reaction to stress     Rx / DC Orders ED Discharge Orders         Ordered    hydrOXYzine (ATARAX/VISTARIL) 25 MG tablet  Every 6 hours PRN     Discontinue  Reprint     07/10/20 0134           Antony Madura, PA-C 07/10/20 0222    Shon Baton, MD 07/10/20 [phone]

## 2020-07-12 ENCOUNTER — Other Ambulatory Visit: Payer: Self-pay | Admitting: Orthopedic Surgery

## 2020-07-24 ENCOUNTER — Other Ambulatory Visit: Payer: Self-pay

## 2020-07-24 ENCOUNTER — Encounter (HOSPITAL_BASED_OUTPATIENT_CLINIC_OR_DEPARTMENT_OTHER): Payer: Self-pay | Admitting: Orthopedic Surgery

## 2020-07-27 ENCOUNTER — Other Ambulatory Visit (HOSPITAL_COMMUNITY)
Admission: RE | Admit: 2020-07-27 | Discharge: 2020-07-27 | Disposition: A | Discharge status: Home / Self Care | Payer: BC Managed Care – PPO | Source: Ambulatory Visit | Attending: Orthopedic Surgery | Admitting: Orthopedic Surgery

## 2020-07-27 DIAGNOSIS — Z20822 Contact with and (suspected) exposure to covid-19: Secondary | ICD-10-CM | POA: Diagnosis not present

## 2020-07-27 DIAGNOSIS — Z01812 Encounter for preprocedural laboratory examination: Secondary | ICD-10-CM | POA: Diagnosis not present

## 2020-07-27 LAB — SARS CORONAVIRUS 2 (TAT 6-24 HRS): SARS Coronavirus 2: NEGATIVE

## 2020-07-30 NOTE — Progress Notes (Signed)

## 2020-07-31 ENCOUNTER — Ambulatory Visit (HOSPITAL_BASED_OUTPATIENT_CLINIC_OR_DEPARTMENT_OTHER): Payer: BC Managed Care – PPO | Admitting: Anesthesiology

## 2020-07-31 ENCOUNTER — Ambulatory Visit (HOSPITAL_BASED_OUTPATIENT_CLINIC_OR_DEPARTMENT_OTHER)
Admission: RE | Admit: 2020-07-31 | Discharge: 2020-07-31 | Disposition: A | Discharge status: Home / Self Care | Payer: BC Managed Care – PPO | Attending: Orthopedic Surgery | Admitting: Orthopedic Surgery

## 2020-07-31 ENCOUNTER — Encounter (HOSPITAL_BASED_OUTPATIENT_CLINIC_OR_DEPARTMENT_OTHER)
Admission: RE | Disposition: A | Discharge status: Home / Self Care | Payer: Self-pay | Source: Home / Self Care | Attending: Orthopedic Surgery

## 2020-07-31 ENCOUNTER — Encounter (HOSPITAL_BASED_OUTPATIENT_CLINIC_OR_DEPARTMENT_OTHER): Payer: Self-pay | Admitting: Orthopedic Surgery

## 2020-07-31 DIAGNOSIS — M65342 Trigger finger, left ring finger: Secondary | ICD-10-CM | POA: Diagnosis not present

## 2020-07-31 DIAGNOSIS — M1990 Unspecified osteoarthritis, unspecified site: Secondary | ICD-10-CM | POA: Diagnosis not present

## 2020-07-31 DIAGNOSIS — Z888 Allergy status to other drugs, medicaments and biological substances status: Secondary | ICD-10-CM | POA: Diagnosis not present

## 2020-07-31 DIAGNOSIS — Z885 Allergy status to narcotic agent status: Secondary | ICD-10-CM | POA: Insufficient documentation

## 2020-07-31 HISTORY — PX: TRIGGER FINGER RELEASE: SHX641

## 2020-07-31 SURGERY — RELEASE, A1 PULLEY, FOR TRIGGER FINGER
Anesthesia: Monitor Anesthesia Care | Site: Hand | Laterality: Left

## 2020-07-31 MED ORDER — BUPIVACAINE HCL (PF) 0.25 % IJ SOLN
INTRAMUSCULAR | Status: DC | PRN
  Administered 2020-07-31: 9 mL

## 2020-07-31 MED ORDER — FENTANYL CITRATE (PF) 100 MCG/2ML IJ SOLN
INTRAMUSCULAR | Status: DC | PRN
Start: 2020-07-31 — End: 2020-07-31
  Administered 2020-07-31: 100 ug via INTRAVENOUS

## 2020-07-31 MED ORDER — LIDOCAINE HCL (PF) 0.5 % IJ SOLN
INTRAMUSCULAR | Status: DC | PRN
  Administered 2020-07-31: 30 mL via INTRAVENOUS

## 2020-07-31 MED ORDER — MEPERIDINE HCL 25 MG/ML IJ SOLN: 6.2500 mg | INTRAMUSCULAR | Status: DC | PRN

## 2020-07-31 MED ORDER — ONDANSETRON HCL 4 MG/2ML IJ SOLN
INTRAMUSCULAR | Status: AC
  Filled 2020-07-31: qty 10

## 2020-07-31 MED ORDER — PROPOFOL 500 MG/50ML IV EMUL
INTRAVENOUS | Status: DC | PRN
  Administered 2020-07-31: 75 ug/kg/min via INTRAVENOUS

## 2020-07-31 MED ORDER — CEFAZOLIN SODIUM-DEXTROSE 2-4 GM/100ML-% IV SOLN
INTRAVENOUS | Status: AC
  Filled 2020-07-31: qty 100

## 2020-07-31 MED ORDER — EPHEDRINE 5 MG/ML INJ
INTRAVENOUS | Status: AC
  Filled 2020-07-31: qty 10

## 2020-07-31 MED ORDER — DEXAMETHASONE SODIUM PHOSPHATE 10 MG/ML IJ SOLN
INTRAMUSCULAR | Status: AC
  Filled 2020-07-31: qty 1

## 2020-07-31 MED ORDER — LIDOCAINE 2% (20 MG/ML) 5 ML SYRINGE
INTRAMUSCULAR | Status: AC
  Filled 2020-07-31: qty 25

## 2020-07-31 MED ORDER — FENTANYL CITRATE (PF) 100 MCG/2ML IJ SOLN
INTRAMUSCULAR | Status: AC
  Filled 2020-07-31: qty 2

## 2020-07-31 MED ORDER — MIDAZOLAM HCL 2 MG/2ML IJ SOLN
INTRAMUSCULAR | Status: AC
  Filled 2020-07-31: qty 2

## 2020-07-31 MED ORDER — OXYCODONE HCL 5 MG PO TABS: 5.0000 mg | ORAL_TABLET | Freq: Once | ORAL | Status: DC | PRN

## 2020-07-31 MED ORDER — LACTATED RINGERS IV SOLN: INTRAVENOUS | Status: DC

## 2020-07-31 MED ORDER — ACETAMINOPHEN 325 MG PO TABS: 325.0000 mg | ORAL_TABLET | ORAL | Status: DC | PRN

## 2020-07-31 MED ORDER — ONDANSETRON HCL 4 MG/2ML IJ SOLN
INTRAMUSCULAR | Status: DC | PRN
  Administered 2020-07-31: 4 mg via INTRAVENOUS

## 2020-07-31 MED ORDER — OXYCODONE HCL 5 MG/5ML PO SOLN: 5.0000 mg | Freq: Once | ORAL | Status: DC | PRN

## 2020-07-31 MED ORDER — CELECOXIB 200 MG PO CAPS: 200.0000 mg | ORAL_CAPSULE | Freq: Two times a day (BID) | ORAL | 0 refills | Status: AC

## 2020-07-31 MED ORDER — CEFAZOLIN SODIUM-DEXTROSE 2-4 GM/100ML-% IV SOLN
2.0000 g | INTRAVENOUS | Status: AC
  Administered 2020-07-31: 2 g via INTRAVENOUS

## 2020-07-31 MED ORDER — LIDOCAINE HCL (CARDIAC) PF 100 MG/5ML IV SOSY
PREFILLED_SYRINGE | INTRAVENOUS | Status: DC | PRN
  Administered 2020-07-31: 30 mg via INTRAVENOUS

## 2020-07-31 MED ORDER — FENTANYL CITRATE (PF) 100 MCG/2ML IJ SOLN: 25.0000 ug | INTRAMUSCULAR | Status: DC | PRN

## 2020-07-31 MED ORDER — ACETAMINOPHEN 160 MG/5ML PO SOLN: 325.0000 mg | ORAL | Status: DC | PRN

## 2020-07-31 MED ORDER — ONDANSETRON HCL 4 MG/2ML IJ SOLN: 4.0000 mg | Freq: Once | INTRAMUSCULAR | Status: DC | PRN

## 2020-07-31 MED ORDER — MIDAZOLAM HCL 5 MG/5ML IJ SOLN
INTRAMUSCULAR | Status: DC | PRN
  Administered 2020-07-31: 2 mg via INTRAVENOUS

## 2020-07-31 SURGICAL SUPPLY — 35 items
APL PRP STRL LF DISP 70% ISPRP (MISCELLANEOUS) ×1
BLADE SURG 15 STRL LF DISP TIS (BLADE) ×1 IMPLANT
BLADE SURG 15 STRL SS (BLADE) ×2
BNDG CMPR 9X4 STRL LF SNTH (GAUZE/BANDAGES/DRESSINGS) ×1
BNDG COHESIVE 2X5 TAN STRL LF (GAUZE/BANDAGES/DRESSINGS) ×2 IMPLANT
BNDG ESMARK 4X9 LF (GAUZE/BANDAGES/DRESSINGS) ×2 IMPLANT
CHLORAPREP W/TINT 26 (MISCELLANEOUS) ×2 IMPLANT
CORD BIPOLAR FORCEPS 12FT (ELECTRODE) IMPLANT
COVER BACK TABLE 60X90IN (DRAPES) ×2 IMPLANT
COVER MAYO STAND STRL (DRAPES) ×2 IMPLANT
COVER WAND RF STERILE (DRAPES) IMPLANT
CUFF TOURN SGL QUICK 18X4 (TOURNIQUET CUFF) ×2 IMPLANT
DECANTER SPIKE VIAL GLASS SM (MISCELLANEOUS) IMPLANT
DRAPE EXTREMITY T 121X128X90 (DISPOSABLE) ×2 IMPLANT
DRAPE SURG 17X23 STRL (DRAPES) ×2 IMPLANT
GAUZE SPONGE 4X4 12PLY STRL (GAUZE/BANDAGES/DRESSINGS) ×2 IMPLANT
GAUZE XEROFORM 1X8 LF (GAUZE/BANDAGES/DRESSINGS) ×2 IMPLANT
GLOVE BIOGEL M 6.5 STRL (GLOVE) ×2 IMPLANT
GLOVE BIOGEL PI IND STRL 7.0 (GLOVE) ×1 IMPLANT
GLOVE BIOGEL PI IND STRL 8.5 (GLOVE) ×1 IMPLANT
GLOVE BIOGEL PI INDICATOR 7.0 (GLOVE) ×1
GLOVE BIOGEL PI INDICATOR 8.5 (GLOVE) ×1
GLOVE SURG ORTHO 8.0 STRL STRW (GLOVE) ×2 IMPLANT
GOWN STRL REUS W/ TWL LRG LVL3 (GOWN DISPOSABLE) ×1 IMPLANT
GOWN STRL REUS W/TWL LRG LVL3 (GOWN DISPOSABLE) ×2
GOWN STRL REUS W/TWL XL LVL3 (GOWN DISPOSABLE) ×2 IMPLANT
NEEDLE PRECISIONGLIDE 27X1.5 (NEEDLE) ×2 IMPLANT
NS IRRIG 1000ML POUR BTL (IV SOLUTION) ×2 IMPLANT
PACK BASIN DAY SURGERY FS (CUSTOM PROCEDURE TRAY) ×2 IMPLANT
STOCKINETTE 4X48 STRL (DRAPES) ×2 IMPLANT
SUT ETHILON 4 0 PS 2 18 (SUTURE) ×2 IMPLANT
SYR BULB EAR ULCER 3OZ GRN STR (SYRINGE) ×2 IMPLANT
SYR CONTROL 10ML LL (SYRINGE) ×2 IMPLANT
TOWEL GREEN STERILE FF (TOWEL DISPOSABLE) ×4 IMPLANT
UNDERPAD 30X36 HEAVY ABSORB (UNDERPADS AND DIAPERS) ×2 IMPLANT

## 2020-07-31 NOTE — Op Note (Signed)
NAME: Shelby Jensen MEDICAL RECORD NO: [id number] DATE OF BIRTH: 03/25/1966 FACILITY: [facility] LOCATION: [facility] PHYSICIAN: Nicki Reaper, MD   OPERATIVE REPORT   DATE OF PROCEDURE: 07/31/20    PREOPERATIVE DIAGNOSIS:   Stenosing tenosynovitis left ring finger   POSTOPERATIVE DIAGNOSIS:   Same   PROCEDURE:   Release A1 pulley left ring finger   SURGEON: Cindee Salt, M.D.   ASSISTANT: none   ANESTHESIA:  Bier block with sedation and Local   INTRAVENOUS FLUIDS:  Per anesthesia flow sheet.   ESTIMATED BLOOD LOSS:  Minimal.   COMPLICATIONS:  None.   SPECIMENS:  none   TOURNIQUET TIME:    Total Tourniquet Time Documented: Forearm (Left) - 16 minutes Total: Forearm (Left) - 16 minutes    DISPOSITION:  Stable to PACU.   INDICATIONS: Patient is a 54 year old female with a history of triggering of her left ring finger.  This not responded to conservative treatment.  She has elected to undergo surgical release of the A1 pulley.  Preperi-and postoperative course been discussed along with risks and complications.  She is aware there is no guarantee to the surgery the possibility of infection recurrence injury to arteries nerves tendons complete relief of symptoms and dystrophy.  Preoperative area the patient seen the extremity marked by both patient and surgeon antibiotic given  OPERATIVE COURSE: Patient is brought to the operating room where form based IV regional anesthetic was carried out without difficulty after the patient was placed in the supine position.  She was prepped and draped after ChloraPrep prep and a 3-minute dry time was allowed.  A timeout was taken to confirm patient procedure.  An oblique incision was made over the A1 pulley area of the left palm left ring finger and carried down through subcutaneous tissue.  Factors were placed retracting the neurovascular bundles radially and ulnarly.  The A1 pulley was thickened and this was released in  its radial aspect a small incision made centrally and A2.  Tenosynovial tissue proximally was incised the 2 tendons were then separated with blunt dissection retractors.  This broke any adhesions.  The finger was placed through full passive range of motion no further trigger was noted.  The wound was copious irrigated with saline.  The skin was closed erupted 4 nylon sutures.  Local infiltration of quarter percent bupivacaine without epinephrine was given approximately 8 cc was used sterile compressive dressing with fingers 3 was applied.  Deflation of the tourniquet all fingers immediately pink.  She was taken to the recovery room for observation in satisfactory condition.  She will be discharged home to return to the [facility] in 1 week with Celebrex 2 a day.  Can also take Tylenol with that.   Cindee Salt, MD Electronically signed, 07/31/20

## 2020-07-31 NOTE — Discharge Instructions (Signed)
Post Anesthesia Home Care Instructions  Activity: Get plenty of rest for the remainder of the day. A responsible individual must stay with you for 24 hours following the procedure.  For the next 24 hours, DO NOT: -Drive a car -Operate machinery -Drink alcoholic beverages -Take any medication unless instructed by your physician -Make any legal decisions or sign important papers.  Meals: Start with liquid foods such as gelatin or soup. Progress to regular foods as tolerated. Avoid greasy, spicy, heavy foods. If nausea and/or vomiting occur, drink only clear liquids until the nausea and/or vomiting subsides. Call your physician if vomiting continues.  Special Instructions/Symptoms: Your throat may feel dry or sore from the anesthesia or the breathing tube placed in your throat during surgery. If this causes discomfort, gargle with warm salt water. The discomfort should disappear within 24 hours.  If you had a scopolamine patch placed behind your ear for the management of post- operative nausea and/or vomiting:  1. The medication in the patch is effective for 72 hours, after which it should be removed.  Wrap patch in a tissue and discard in the trash. Wash hands thoroughly with soap and water. 2. You may remove the patch earlier than 72 hours if you experience unpleasant side effects which may include dry mouth, dizziness or visual disturbances. 3. Avoid touching the patch. Wash your hands with soap and water after contact with the patch.      Regional Anesthesia Blocks  1. Numbness or the inability to move the "blocked" extremity may last from 3-48 hours after placement. The length of time depends on the medication injected and your individual response to the medication. If the numbness is not going away after 48 hours, call your surgeon.  2. The extremity that is blocked will need to be protected until the numbness is gone and the  Strength has returned. Because you cannot feel it, you  will need to take extra care to avoid injury. Because it may be weak, you may have difficulty moving it or using it. You may not know what position it is in without looking at it while the block is in effect.  3. For blocks in the legs and feet, returning to weight bearing and walking needs to be done carefully. You will need to wait until the numbness is entirely gone and the strength has returned. You should be able to move your leg and foot normally before you try and bear weight or walk. You will need someone to be with you when you first try to ensure you do not fall and possibly risk injury.  4. Bruising and tenderness at the needle site are common side effects and will resolve in a few days.  5. Persistent numbness or new problems with movement should be communicated to the surgeon or the [facility] ([phone])/ [facility] ([phone]).    Hand Center Instructions Hand Surgery  Wound Care: Keep your hand elevated above the level of your heart.  Do not allow it to dangle by your side.  Keep the dressing dry and do not remove it unless your doctor advises you to do so.  He will usually change it at the time of your post-op visit.  Moving your fingers is advised to stimulate circulation but will depend on the site of your surgery.  If you have a splint applied, your doctor will advise you regarding movement.  Activity: Do not drive or operate machinery today.  Rest today and   then you may return to your normal activity and work as indicated by your physician.  Diet:  Drink liquids today or eat a light diet.  You may resume a regular diet tomorrow.    General expectations: Pain for two to three days. Fingers may become slightly swollen.  Call your doctor if any of the following occur: Severe pain not relieved by pain medication. Elevated temperature. Dressing soaked with blood. Inability to move fingers. White or bluish color to fingers.  

## 2020-07-31 NOTE — H&P (Signed)
  Shelby Jensen is an 54 y.o. female.   Chief Complaint: catching left ring finger HPI: HPITarsha returns the office today for examination of her left ring finger trigger. This has been injected on 2 occasions.  she was being weaned from a mallet deformity splint at that time to her small finger. Her small finger is done very well but she continues to have triggering of her left ring finger but with moderate to severe pain when it catches. She has no history of new injury. States that she is tired of it at this point time and would like to have it resolved. She is not complain of any numbness or tingling or other fingers catching. She has a history of arthritis no history of diabetes thyroid problems or gout. Family history is positive diabetes arthritis gout negative for the remainder. She has been tested for diabetes    Past Medical History:  Diagnosis Date  . Allergic rhinitis due to pollen   . Allergy   . Arthritis   . Diaphragmatic hernia without mention of obstruction or gangrene   . Hyperlipidemia   . Pain in joint, pelvic region and thigh   . Pain in limb   . Syncope and collapse   . Unspecified vitamin D deficiency     Past Surgical History:  Procedure Laterality Date  . CESAREAN SECTION    . TUBAL LIGATION      Family History  Problem Relation Age of Onset  . Cancer Mother   . Heart disease Father    Social History:  reports that she has never smoked. She has never used smokeless tobacco. She reports that she does not drink alcohol and does not use drugs.  Allergies:  Allergies  Allergen Reactions  . Codeine Hives  . Darvocet [Propoxyphene N-Acetaminophen] Other (See Comments)    Unknown   . Other Other (See Comments)    Unknown   . Tramadol Other (See Comments)    unknown    No medications prior to admission.    No results found for this or any previous visit (from the past 48 hour(s)).  No results found.   Pertinent items are noted in  HPI.  Height 5\' 1"  (1.549 m), weight 66.2 kg, last menstrual period 12/01/1998.  General appearance: alert, cooperative and appears stated age Head: Normocephalic, without obvious abnormality Neck: no JVD Resp: clear to auscultation bilaterally Cardio: regular rate and rhythm, S1, S2 normal, no murmur, click, rub or gallop GI: soft, non-tender; bowel sounds normal; no masses,  no organomegaly Extremities: catching left ring finger Pulses: 2+ and symmetric Skin: Skin color, texture, turgor normal. No rashes or lesions Neurologic: Grossly normal Incision/Wound:na  Assessment/Plan Assessment:  1. Trigger ring finger of left hand    Plan: She would like to proceed to have this surgically released. Preperi-and postoperative course been discussed along with the risks and complications. She is advised that there is no guarantee to the surgery possibility of infection recurrence injury to arteries nerves tendons incomplete relief symptoms dystrophy. She is scheduled for release A1 pulley left ring finger as an outpatient under regional anesthesia. She is cannot take any codeine or tramadol. We will preop her with Celebrex.     01/30/2000 07/31/2020, 5:47 AM

## 2020-07-31 NOTE — Anesthesia Procedure Notes (Signed)
Anesthesia Regional Block: Bier block (IV Regional)   Pre-Anesthetic Checklist: ,, timeout performed, Correct Patient, Correct Site, Correct Laterality, Correct Procedure,, site marked, surgical consent,, at surgeon's request  Laterality: Left     Needles:  Injection technique: Single-shot  Needle Type: Other      Needle Gauge: 20     Additional Needles:   Procedures:,,,,, intact distal pulses, Esmarch exsanguination, single tourniquet utilized,  Narrative:   Performed by: Personally       

## 2020-07-31 NOTE — Transfer of Care (Signed)
Immediate Anesthesia Transfer of Care Note  Patient: AKIBA MELFI  Procedure(s) Performed: RELEASE TRIGGER FINGER/A-1 PULLEY LEFT RING FINGER (Left Hand)  Patient Location: PACU  Anesthesia Type:Bier block  Level of Consciousness: awake, alert , oriented and patient cooperative  Airway & Oxygen Therapy: Patient Spontanous Breathing and Patient connected to face mask oxygen  Post-op Assessment: Report given to RN and Post -op Vital signs reviewed and stable  Post vital signs: Reviewed and stable  Last Vitals:  Vitals Value Taken Time  BP    Temp    Pulse 73 07/31/20 0947  Resp 13 07/31/20 0947  SpO2 100 % 07/31/20 0947  Vitals shown include unvalidated device data.  Last Pain:  Vitals:   07/31/20 0816  TempSrc: Oral  PainSc: 0-No pain         Complications: No complications documented.

## 2020-07-31 NOTE — Anesthesia Postprocedure Evaluation (Signed)
Anesthesia Post Note  Patient: Shelby Jensen  Procedure(s) Performed: RELEASE TRIGGER FINGER/A-1 PULLEY LEFT RING FINGER (Left Hand)     Patient location during evaluation: PACU Anesthesia Type: MAC Level of consciousness: awake and alert Pain management: pain level controlled Vital Signs Assessment: post-procedure vital signs reviewed and stable Respiratory status: spontaneous breathing, nonlabored ventilation, respiratory function stable and patient connected to nasal cannula oxygen Cardiovascular status: stable and blood pressure returned to baseline Postop Assessment: no apparent nausea or vomiting Anesthetic complications: no   No complications documented.  Last Vitals:  Vitals:   07/31/20 1010 07/31/20 1021  BP: (!) 149/84 121/87  Pulse: (!) 48 (!) 52  Resp: 12 14  Temp:  36.7 C  SpO2: 100% 100%    Last Pain:  Vitals:   07/31/20 1021  TempSrc:   PainSc: 0-No pain                 Dael Howland

## 2020-07-31 NOTE — Anesthesia Procedure Notes (Signed)
Procedure Name: MAC Date/Time: 07/31/2020 9:30 AM Performed by: Signe Colt, CRNA Pre-anesthesia Checklist: Patient identified, Emergency Drugs available, Suction available, Patient being monitored and Timeout performed Patient Re-evaluated:Patient Re-evaluated prior to induction Oxygen Delivery Method: Simple face mask

## 2020-07-31 NOTE — Brief Op Note (Signed)
07/31/2020  9:46 AM  PATIENT:  Shelby Jensen  54 y.o. female  PRE-OPERATIVE DIAGNOSIS:  STENOSING TENOSYNOVITIS LEFT RING FINGER  POST-OPERATIVE DIAGNOSIS:  STENOSING TENOSYNOVITIS LEFT RING FINGER  PROCEDURE:  Procedure(s): RELEASE TRIGGER FINGER/A-1 PULLEY LEFT RING FINGER (Left)  SURGEON:  Surgeon(s) and Role:    * Cindee Salt, MD - Primary  PHYSICIAN ASSISTANT:   ASSISTANTS: none   ANESTHESIA:   local, regional and IV sedation  EBL:  70ml  BLOOD ADMINISTERED:none  DRAINS: none   LOCAL MEDICATIONS USED:  BUPIVICAINE   SPECIMEN:  No Specimen  DISPOSITION OF SPECIMEN:  N/A  COUNTS:  YES  TOURNIQUET:   Total Tourniquet Time Documented: Forearm (Left) - 16 minutes Total: Forearm (Left) - 16 minutes   DICTATION: .Dragon Dictation  PLAN OF CARE: Discharge to home after PACU  PATIENT DISPOSITION:  PACU - hemodynamically stable.

## 2020-07-31 NOTE — Anesthesia Preprocedure Evaluation (Addendum)
Anesthesia Evaluation  Patient identified by MRN, date of birth, ID band Patient awake    Reviewed: Allergy & Precautions, NPO status , Patient's Chart, lab work & pertinent test results  Airway Mallampati: II  TM Distance: >3 FB Neck ROM: Full    Dental no notable dental hx.    Pulmonary neg pulmonary ROS,    Pulmonary exam normal breath sounds clear to auscultation       Cardiovascular negative cardio ROS Normal cardiovascular exam Rhythm:Regular Rate:Normal     Neuro/Psych  Neuromuscular disease negative psych ROS   GI/Hepatic Neg liver ROS, GERD  Medicated and Controlled,  Endo/Other  negative endocrine ROS  Renal/GU negative Renal ROS  negative genitourinary   Musculoskeletal  (+) Arthritis , Osteoarthritis,    Abdominal   Peds negative pediatric ROS (+)  Hematology negative hematology ROS (+)   Anesthesia Other Findings   Reproductive/Obstetrics negative OB ROS                            Anesthesia Physical Anesthesia Plan  ASA: II  Anesthesia Plan: MAC and Bier Block and Bier Block-LIDOCAINE ONLY   Post-op Pain Management:    Induction: Intravenous  PONV Risk Score and Plan: 2 and Treatment may vary due to age or medical condition  Airway Management Planned: Mask, Natural Airway and Nasal Cannula  Additional Equipment:   Intra-op Plan:   Post-operative Plan:   Informed Consent:     Dental advisory given  Plan Discussed with: Anesthesiologist and CRNA  Anesthesia Plan Comments: (Pt currently has mild laryngitis.  She states this happens on occasion and is triggered by her post-nasal drip.  She has no other signs or symptoms of infection.  )      Anesthesia Quick Evaluation

## 2020-08-01 ENCOUNTER — Encounter (HOSPITAL_BASED_OUTPATIENT_CLINIC_OR_DEPARTMENT_OTHER): Payer: Self-pay | Admitting: Orthopedic Surgery

## 2020-09-18 ENCOUNTER — Emergency Department (HOSPITAL_COMMUNITY): Payer: BC Managed Care – PPO

## 2020-09-18 ENCOUNTER — Emergency Department (HOSPITAL_COMMUNITY)
Admission: EM | Admit: 2020-09-18 | Discharge: 2020-09-18 | Disposition: A | Discharge status: Home / Self Care | Payer: BC Managed Care – PPO | Attending: Emergency Medicine | Admitting: Emergency Medicine

## 2020-09-18 ENCOUNTER — Other Ambulatory Visit: Payer: Self-pay

## 2020-09-18 ENCOUNTER — Encounter (HOSPITAL_COMMUNITY): Payer: Self-pay

## 2020-09-18 DIAGNOSIS — R251 Tremor, unspecified: Secondary | ICD-10-CM | POA: Insufficient documentation

## 2020-09-18 DIAGNOSIS — R42 Dizziness and giddiness: Secondary | ICD-10-CM | POA: Insufficient documentation

## 2020-09-18 DIAGNOSIS — R000 Tachycardia, unspecified: Secondary | ICD-10-CM | POA: Diagnosis not present

## 2020-09-18 DIAGNOSIS — R0602 Shortness of breath: Secondary | ICD-10-CM | POA: Diagnosis not present

## 2020-09-18 DIAGNOSIS — R002 Palpitations: Secondary | ICD-10-CM | POA: Diagnosis not present

## 2020-09-18 LAB — CBC WITH DIFFERENTIAL/PLATELET
Abs Immature Granulocytes: 0.01 10*3/uL (ref 0.00–0.07)
Basophils Absolute: 0.1 10*3/uL (ref 0.0–0.1)
Basophils Relative: 1 %
Eosinophils Absolute: 0.2 10*3/uL (ref 0.0–0.5)
Eosinophils Relative: 4 %
HCT: 37.6 % (ref 36.0–46.0)
Hemoglobin: 12 g/dL (ref 12.0–15.0)
Immature Granulocytes: 0 %
Lymphocytes Relative: 37 %
Lymphs Abs: 2.3 10*3/uL (ref 0.7–4.0)
MCH: 27.9 pg (ref 26.0–34.0)
MCHC: 31.9 g/dL (ref 30.0–36.0)
MCV: 87.4 fL (ref 80.0–100.0)
Monocytes Absolute: 0.4 10*3/uL (ref 0.1–1.0)
Monocytes Relative: 6 %
Neutro Abs: 3.1 10*3/uL (ref 1.7–7.7)
Neutrophils Relative %: 52 %
Platelets: 306 10*3/uL (ref 150–400)
RBC: 4.3 MIL/uL (ref 3.87–5.11)
RDW: 12.6 % (ref 11.5–15.5)
WBC: 6 10*3/uL (ref 4.0–10.5)
nRBC: 0 % (ref 0.0–0.2)

## 2020-09-18 LAB — COMPREHENSIVE METABOLIC PANEL
ALT: 13 U/L (ref 0–44)
AST: 19 U/L (ref 15–41)
Albumin: 4.2 g/dL (ref 3.5–5.0)
Alkaline Phosphatase: 58 U/L (ref 38–126)
Anion gap: 9 (ref 5–15)
BUN: 15 mg/dL (ref 6–20)
CO2: 24 mmol/L (ref 22–32)
Calcium: 8.4 mg/dL — ABNORMAL LOW (ref 8.9–10.3)
Chloride: 105 mmol/L (ref 98–111)
Creatinine, Ser: 0.84 mg/dL (ref 0.44–1.00)
GFR, Estimated: >60 mL/min (ref >60)
Glucose, Bld: 122 mg/dL — ABNORMAL HIGH (ref 70–99)
Potassium: 3.2 mmol/L — ABNORMAL LOW (ref 3.5–5.1)
Sodium: 138 mmol/L (ref 135–145)
Total Bilirubin: 0.6 mg/dL (ref 0.3–1.2)
Total Protein: 7.3 g/dL (ref 6.5–8.1)

## 2020-09-18 LAB — TSH: TSH: 0.392 u[IU]/mL (ref 0.350–4.500)

## 2020-09-18 LAB — D-DIMER, QUANTITATIVE: D-Dimer, Quant: <0.27 ug/mL-FEU (ref 0.00–0.50)

## 2020-09-18 LAB — TROPONIN I (HIGH SENSITIVITY): Troponin I (High Sensitivity): 2 ng/L (ref <18)

## 2020-09-18 MED ORDER — LORAZEPAM 2 MG/ML IJ SOLN
0.5000 mg | Freq: Once | INTRAMUSCULAR | Status: AC
  Administered 2020-09-18: 0.5 mg via INTRAVENOUS
  Filled 2020-09-18: qty 1

## 2020-09-18 MED ORDER — LORAZEPAM 0.5 MG PO TABS: 1.0000 mg | ORAL_TABLET | Freq: Three times a day (TID) | ORAL | 0 refills | Status: AC | PRN

## 2020-09-18 MED ORDER — POTASSIUM CHLORIDE CRYS ER 20 MEQ PO TBCR
40.0000 meq | EXTENDED_RELEASE_TABLET | Freq: Once | ORAL | Status: AC
  Administered 2020-09-18: 40 meq via ORAL
  Filled 2020-09-18: qty 2

## 2020-09-18 NOTE — ED Triage Notes (Signed)
Pt describes a "jittery and racing" feeling that feels like her insides are melting. Pt says this jittery feeling has been going on for 3 days.

## 2020-09-18 NOTE — Discharge Instructions (Signed)
Please read and follow all provided instructions.  Your diagnoses today include:  1. Tachycardia   2. Palpitations     Tests performed today include:  EKG -shows fast, but regular heart rate  Blood counts and electrolytes -slightly low potassium, otherwise normal  Thyroid test -was normal  Screening test for blood clot -was normal  Cardiac enzyme -no signs of stress on the heart  Vital signs. See below for your results today.   Medications prescribed:   Ativan - medication to take as needed for anxiety, this is a short-term medication until you can see your doctor  Take any prescribed medications only as directed.  Home care instructions:  Follow any educational materials contained in this packet.  BE VERY CAREFUL not to take multiple medicines containing Tylenol (also called acetaminophen). Doing so can lead to an overdose which can damage your liver and cause liver failure and possibly death.   Follow-up instructions: Please follow-up with your primary care provider in the next 3 days for further evaluation of your symptoms.   Return instructions:   Please return to the Emergency Department if you experience worsening symptoms.   Please return if you have any other emergent concerns.  Additional Information:  Your vital signs today were: BP 131/83 (BP Location: Right Arm)    Pulse 94    Temp 98.1 F (36.7 C) (Oral)    Resp 16    Ht 5\' 1"  (1.549 m)    Wt 72.6 kg    LMP 12/01/1998    SpO2 100%    BMI 30.23 kg/m  If your blood pressure (BP) was elevated above 135/85 this visit, please have this repeated by your doctor within one month. --------------

## 2020-09-18 NOTE — ED Provider Notes (Signed)
[facility]   CSN: [id number] Arrival date & time: 09/18/20  1905     History No chief complaint on file.   Shelby Jensen is a 54 y.o. female.  Patient presents to the emergency department today for palpitations and fast heart rate.  Patient states that she has been feeling jittery and anxious over the past several days, but her symptoms became much worse today.  She feels lightheaded at times with mild shortness of breath.  No chest pains.  She has not had nausea, vomiting, or diarrhea and has been eating and drinking well.  She does report stress from a recent separation with her husband.  No lower extremity swelling or tenderness. Patient denies risk factors for pulmonary embolism including: unilateral leg swelling, history of DVT/PE/other blood clots, use of exogenous hormones, recent immobilizations, recent surgery (other than trigger finger procedure), recent travel (>4hr segment), malignancy, hemoptysis.  No cardiac or pulmonary history.  She states that her symptoms today feel like more than anxiety and she feels like "my insides are melting".  She denies history of thyroid problems.  No recent stimulant medications.  She denies drug or alcohol abuse.         Past Medical History:  Diagnosis Date  . Allergic rhinitis due to pollen   . Allergy   . Arthritis   . Diaphragmatic hernia without mention of obstruction or gangrene   . Hyperlipidemia   . Pain in joint, pelvic region and thigh   . Pain in limb   . Syncope and collapse   . Unspecified vitamin D deficiency     Patient Active Problem List   Diagnosis Date Noted  . Mild obesity 01/28/2013  . Situational stress 01/28/2013    Past Surgical History:  Procedure Laterality Date  . CESAREAN SECTION    . TRIGGER FINGER RELEASE Left 07/31/2020   Procedure: RELEASE TRIGGER FINGER/A-1 PULLEY LEFT RING FINGER;  Surgeon: Cindee Salt, MD;  Location: [facility];  Service: Orthopedics;  Laterality: Left;  . TUBAL LIGATION       OB History   No obstetric history on file.     Family History  Problem Relation Age of Onset  . Cancer Mother   . Heart disease Father     Social History   Tobacco Use  . Smoking status: Never Smoker  . Smokeless tobacco: Never Used  Substance Use Topics  . Alcohol use: No  . Drug use: No    Home Medications Prior to Admission medications   Medication Sig Start Date End Date Taking? Authorizing Provider  atorvastatin (LIPITOR) 20 MG tablet Take 20 mg by mouth daily. 02/20/20   [provider]  celecoxib (CELEBREX) 200 MG capsule Take 1 capsule (200 mg total) by mouth 2 (two) times daily. 07/31/20   Cindee Salt, MD  Cholecalciferol (MAXIMUM D3) 10000 units CAPS Take 10,000 Units by mouth daily.     [provider]  hydrOXYzine (ATARAX/VISTARIL) 25 MG tablet Take 1 tablet (25 mg total) by mouth every 6 (six) hours as needed for anxiety. 07/10/20   Antony Madura, PA-C  Multiple Vitamin (MULTIVITAMIN ADULT PO) Take 1 tablet by mouth daily.    [provider]  pantoprazole (PROTONIX) 40 MG tablet Take 40 mg by mouth daily. 01/18/20   [provider]    Allergies    Codeine, Darvocet [propoxyphene n-acetaminophen], Other, and Tramadol  Review of Systems   Review of Systems  Constitutional:  Negative for appetite change and fever.  HENT: Negative for rhinorrhea and sore throat.   Eyes: Negative for redness.  Respiratory: Positive for shortness of breath. Negative for cough and chest tightness.   Cardiovascular: Negative for chest pain.  Gastrointestinal: Negative for abdominal pain, diarrhea, nausea and vomiting.  Genitourinary: Negative for dysuria, frequency, hematuria and urgency.  Musculoskeletal: Negative for myalgias.  Skin: Negative for rash.  Neurological: Positive for tremors and light-headedness. Negative for headaches.  Psychiatric/Behavioral: The  patient is nervous/anxious.     Physical Exam Updated Vital Signs BP (!) 159/92 (BP Location: Left Arm)   Pulse (!) 134   Temp 98.1 F (36.7 C) (Oral)   Resp (!) 22   Ht 5\' 1"  (1.549 m)   Wt 72.6 kg   LMP 12/01/1998   SpO2 99%   BMI 30.23 kg/m   Physical Exam Vitals and nursing note reviewed.  Constitutional:      General: She is not in acute distress.    Appearance: She is well-developed.  HENT:     Head: Normocephalic and atraumatic.     Right Ear: External ear normal.     Left Ear: External ear normal.     Nose: Nose normal.  Eyes:     Conjunctiva/sclera: Conjunctivae normal.  Cardiovascular:     Rate and Rhythm: Regular rhythm. Tachycardia present.     Heart sounds: No murmur heard.   Pulmonary:     Effort: No respiratory distress.     Breath sounds: No wheezing, rhonchi or rales.  Abdominal:     Palpations: Abdomen is soft.     Tenderness: There is no abdominal tenderness. There is no guarding or rebound.  Musculoskeletal:     Cervical back: Normal range of motion and neck supple.     Right lower leg: No edema.     Left lower leg: No edema.  Skin:    General: Skin is warm and dry.     Findings: No rash.  Neurological:     General: No focal deficit present.     Mental Status: She is alert. Mental status is at baseline.     Motor: Tremor present. No weakness.  Psychiatric:        Attention and Perception: Attention normal.        Mood and Affect: Mood is anxious.        Speech: Speech normal.        Behavior: Behavior normal.        Thought Content: Thought content normal.     ED Results / Procedures / Treatments   Labs (all labs ordered are listed, but only abnormal results are displayed) Labs Reviewed  CBC WITH DIFFERENTIAL/PLATELET  COMPREHENSIVE METABOLIC PANEL  TSH  D-DIMER, QUANTITATIVE (NOT AT [facility])  TROPONIN I (HIGH SENSITIVITY)    ED ECG REPORT   Date: 09/18/2020  Rate: 124  Rhythm: sinus tachycardia  QRS Axis: normal  Intervals:  QT prolonged  ST/T Wave abnormalities: normal  Conduction Disutrbances:none  Narrative Interpretation:   Old EKG Reviewed: changes noted, faster since 07/10/20 and QT longer.   I have personally reviewed the EKG tracing and agree with the computerized printout as noted.  Radiology DG Chest 2 View  Result Date: 09/18/2020 CLINICAL DATA:  Tachycardia EXAM: CHEST - 2 VIEW COMPARISON:  08/17/2007 FINDINGS: The heart size and mediastinal contours are within normal limits. Both lungs are clear. The visualized skeletal structures are unremarkable. IMPRESSION: No active cardiopulmonary disease. Electronically Signed   By:  Jasmine Pang M.D.   On: 09/18/2020 20:43    Procedures Procedures (including critical care time)  Medications Ordered in ED Medications  potassium chloride SA (KLOR-CON) CR tablet 40 mEq (has no administration in time range)  LORazepam (ATIVAN) injection 0.5 mg (0.5 mg Intravenous Given 09/18/20 2057)    ED Course  I have reviewed the triage vital signs and the nursing notes.  Pertinent labs & imaging results that were available during my care of the patient were reviewed by me and considered in my medical decision making (see chart for details).  Patient seen and examined. Work-up initiated. Medications ordered.  Patient seems anxious.  Unclear etiology of her tachycardia at the current time.  Should her blood pressure is a bit elevated but she is not in any acute distress.  EKG reviewed, shows sinus tachycardia.   Vital signs reviewed and are as follows: BP (!) 159/92 (BP Location: Left Arm)   Pulse (!) 134   Temp 98.1 F (36.7 C) (Oral)   Resp (!) 22   Ht 5\' 1"  (1.549 m)   Wt 72.6 kg   LMP 12/01/1998   SpO2 99%   BMI 30.23 kg/m   9:42 PM patient rechecked.  She is feeling groggy but better than she was.  Pulse rate has improved to normal range.  I reviewed all results with patient at bedside.  She seems reassured.  Encourage patient to get plenty of rest,  hydrate well, avoid caffeine and other stimulants over-the-counter medications.  Encouraged her to follow-up with her primary care doctor in the next several days.   Encourage patient to return if she develops chest pain, shortness of breath, trouble breathing, or if she passes out or has other concerns.    MDM Rules/Calculators/A&P                          Patient with elevated heart rate and palpitations likely related to stress reaction and anxiety given reassuring lab results tonight.  Chest x-ray is normal with no cardiomegaly.  EKG shows sinus tachycardia.  Normal thyroid, D-dimer, troponin.  Patient clinically improved with dose of Ativan.  She does endorse stressors and personal life at current time.  She looks well and is reassured by findings here.  She has primary care with whom she can follow-up in the next several days.   Final Clinical Impression(s) / ED Diagnoses Final diagnoses:  Tachycardia  Palpitations    Rx / DC Orders ED Discharge Orders         Ordered    LORazepam (ATIVAN) 0.5 MG tablet  3 times daily PRN        09/18/20 2140           09/20/20, PA-C 09/18/20 2144    2145, MD 09/19/20 [phone]

## 2021-07-08 ENCOUNTER — Other Ambulatory Visit: Payer: Self-pay | Admitting: Obstetrics and Gynecology

## 2021-07-08 DIAGNOSIS — R928 Other abnormal and inconclusive findings on diagnostic imaging of breast: Secondary | ICD-10-CM

## 2021-07-11 ENCOUNTER — Other Ambulatory Visit: Payer: Self-pay | Admitting: Internal Medicine

## 2021-07-11 DIAGNOSIS — Z1231 Encounter for screening mammogram for malignant neoplasm of breast: Secondary | ICD-10-CM

## 2021-07-26 ENCOUNTER — Other Ambulatory Visit: Payer: Self-pay

## 2021-07-26 ENCOUNTER — Ambulatory Visit
Admission: RE | Admit: 2021-07-26 | Discharge: 2021-07-26 | Disposition: A | Discharge status: Home / Self Care | Payer: BC Managed Care – PPO | Source: Ambulatory Visit | Attending: Obstetrics and Gynecology | Admitting: Obstetrics and Gynecology

## 2021-07-26 ENCOUNTER — Ambulatory Visit: Payer: BC Managed Care – PPO

## 2021-07-26 DIAGNOSIS — R928 Other abnormal and inconclusive findings on diagnostic imaging of breast: Secondary | ICD-10-CM

## 2021-07-30 ENCOUNTER — Emergency Department (HOSPITAL_COMMUNITY)
Admission: EM | Admit: 2021-07-30 | Discharge: 2021-07-31 | Disposition: A | Discharge status: Home / Self Care | Payer: BC Managed Care – PPO | Attending: Emergency Medicine | Admitting: Emergency Medicine

## 2021-07-30 ENCOUNTER — Other Ambulatory Visit: Payer: Self-pay

## 2021-07-30 ENCOUNTER — Encounter (HOSPITAL_COMMUNITY): Payer: Self-pay

## 2021-07-30 DIAGNOSIS — L299 Pruritus, unspecified: Secondary | ICD-10-CM | POA: Insufficient documentation

## 2021-07-30 DIAGNOSIS — Z79899 Other long term (current) drug therapy: Secondary | ICD-10-CM | POA: Diagnosis not present

## 2021-07-30 DIAGNOSIS — R42 Dizziness and giddiness: Secondary | ICD-10-CM | POA: Diagnosis not present

## 2021-07-30 MED ORDER — DIPHENHYDRAMINE HCL 25 MG PO CAPS
25.0000 mg | ORAL_CAPSULE | Freq: Once | ORAL | Status: AC
  Administered 2021-07-30: 25 mg via ORAL
  Filled 2021-07-30: qty 1
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
## 2021-07-30 NOTE — ED Provider Notes (Signed)
[facility]-EMERGENCY DEPT Provider Note   CSN: [id number] Arrival date & time: 07/30/21  2100     History Chief Complaint  Patient presents with   hand itching   Dizziness    Shelby Jensen is a 55 y.o. female. with past medical history of anxiety who presents emergency department for itching to her hands and lightheadedness.  Eats that she went to get dinner tonight and when she got in her car she began having a burning feeling when she starts the steering wheel.  States she tried to wash her hands however this does not resolve the issue.  At that between getting back in her car and getting home she began having lightheadedness, headache, palpitations.  She endorses that this has been going on for the better part of the year however it is more intense tonight which is why she presented to the emergency department.  She states that she additionally has burning and itching when she gets in bed and thinks that it is the sheets.  Also states that she has burning and itching of her feet in her shoes.  She states that she feels like her husband is trying to poison her however states that she feels safe at home with him.  Denies changes to detergents.  Denies food allergies.  Endorses environmental allergies, however none have this effect on her.  She denies having feelings of anxiety or panic at this time.  Denies auditory or visual hallucinations.  Denies SI/HI.  Of note she has been seen multiple times in the emergency department for anxiety and panic attacks.   Past Medical History:  Diagnosis Date   Allergic rhinitis due to pollen    Allergy    Arthritis    Diaphragmatic hernia without mention of obstruction or gangrene    Hyperlipidemia    Pain in joint, pelvic region and thigh    Pain in limb    Syncope and collapse    Unspecified vitamin D deficiency    Patient Active Problem List   Diagnosis Date Noted   Mild obesity 01/28/2013   Situational stress  01/28/2013    Past Surgical History:  Procedure Laterality Date   CESAREAN SECTION     TRIGGER FINGER RELEASE Left 07/31/2020   Procedure: RELEASE TRIGGER FINGER/A-1 PULLEY LEFT RING FINGER;  Surgeon: Cindee Salt, MD;  Location: [facility];  Service: Orthopedics;  Laterality: Left;   TUBAL LIGATION       OB History   No obstetric history on file.    Family History  Problem Relation Age of Onset   Cancer Mother    Heart disease Father    Social History   Tobacco Use   Smoking status: Never   Smokeless tobacco: Never  Substance Use Topics   Alcohol use: No   Drug use: No   Home Medications Prior to Admission medications   Medication Sig Start Date End Date Taking? Authorizing Provider  atorvastatin (LIPITOR) 20 MG tablet Take 20 mg by mouth daily. 02/20/20   [provider]  celecoxib (CELEBREX) 200 MG capsule Take 1 capsule (200 mg total) by mouth 2 (two) times daily. 07/31/20   Cindee Salt, MD  Cholecalciferol (MAXIMUM D3) 10000 units CAPS Take 10,000 Units by mouth daily.     [provider]  hydrOXYzine (ATARAX/VISTARIL) 25 MG tablet Take 1 tablet (25 mg total) by mouth every 6 (six) hours as needed for anxiety. 07/10/20   Antony Madura, PA-C  LORazepam (  ATIVAN) 0.5 MG tablet Take 2 tablets (1 mg total) by mouth 3 (three) times daily as needed for anxiety. 09/18/20   Renne Crigler, PA-C  Multiple Vitamin (MULTIVITAMIN ADULT PO) Take 1 tablet by mouth daily.    [provider]  pantoprazole (PROTONIX) 40 MG tablet Take 40 mg by mouth daily. 01/18/20   [provider]   Allergies    Codeine, Darvocet [propoxyphene n-acetaminophen], Other, and Tramadol  Review of Systems   Review of Systems  Constitutional:  Negative for fever and unexpected weight change.  Eyes: Negative.   Respiratory:  Negative for cough and shortness of breath.   Cardiovascular:  Positive for palpitations. Negative for chest pain.  Gastrointestinal:   Positive for nausea. Negative for abdominal pain and vomiting.  Genitourinary: Negative.   Musculoskeletal: Negative.   Skin: Negative.   Allergic/Immunologic: Positive for environmental allergies. Negative for food allergies.       Allergic to dogs - has a dog. Saliva causes itching and bumps. Describes reason for ED different type of pain/itching.    Neurological:  Positive for dizziness, tremors, light-headedness and headaches. Negative for numbness.  Psychiatric/Behavioral:  The patient is not nervous/anxious.   All other systems reviewed and are negative.  Physical Exam Updated Vital Signs BP (!) 172/95 (BP Location: Left Arm)   Pulse (!) 108   Temp 98.2 F (36.8 C) (Oral)   Resp 18   Ht 5\' 1"  (1.549 m)   Wt 72.6 kg   LMP 12/01/1998   SpO2 99%   BMI 30.23 kg/m   Physical Exam Vitals and nursing note reviewed.  Constitutional:      General: She is not in acute distress.    Appearance: Normal appearance.  HENT:     Head: Normocephalic and atraumatic.     Nose: Nose normal.     Mouth/Throat:     Mouth: Mucous membranes are moist.     Pharynx: Oropharynx is clear. No oropharyngeal exudate or posterior oropharyngeal erythema.  Eyes:     Extraocular Movements: Extraocular movements intact.     Pupils: Pupils are equal, round, and reactive to light.  Cardiovascular:     Rate and Rhythm: Normal rate and regular rhythm.     Pulses: Normal pulses.     Heart sounds: Normal heart sounds. No murmur heard. Pulmonary:     Effort: Pulmonary effort is normal.     Breath sounds: Normal breath sounds.  Abdominal:     General: Bowel sounds are normal.     Palpations: Abdomen is soft.     Tenderness: There is no abdominal tenderness.  Musculoskeletal:        General: Normal range of motion.     Cervical back: Normal range of motion and neck supple.  Skin:    General: Skin is warm and dry.     Capillary Refill: Capillary refill takes less than 2 seconds.     Findings: No rash.   Neurological:     General: No focal deficit present.     Mental Status: She is alert and oriented to person, place, and time. Mental status is at baseline.  Psychiatric:        Attention and Perception: Attention normal. She does not perceive auditory or visual hallucinations.        Mood and Affect: Mood normal.        Speech: Speech normal.        Behavior: Behavior normal. Behavior is cooperative.  Thought Content: Thought content does not include homicidal or suicidal ideation.        Cognition and Memory: Memory normal.   ED Results / Procedures / Treatments   Labs (all labs ordered are listed, but only abnormal results are displayed) Labs Reviewed - No data to display  EKG None  Radiology No results found.  Procedures Procedures   Medications Ordered in ED Medications  diphenhydrAMINE (BENADRYL) capsule 25 mg (25 mg Oral Given 07/30/21 2334)   ED Course  I have reviewed the triage vital signs and the nursing notes.  Pertinent labs & imaging results that were available during my care of the patient were reviewed by me and considered in my medical decision making (see chart for details).  On my exam there is no signs of anaphylaxis.  Airway is patent, no stridor, difficulty breathing. There is no rash present.  We will try a course of Benadryl. EKG with normal sinus rhythm 0001: states Benadryl is not had an effect on itching burning sensation. Patient now stating she has having a "weird" sensation in her head.  She is pointing to her the parietal region.  She is unable to describe further.  MDM Rules/Calculators/A&P Presents emergency department today with complaints of itching and burning in her bilateral hands and feet.  She also endorses having the same sensation when she is getting in bed, putting on shoes, driving.  She stated she feels like her husband is poisoning her; however, states that she feels safe at home with him.  Do not believe this to be an acute  allergic reaction.  Her presentation is not consistent with acute anaphylaxis, angioedema.  There is no evidence of airway compromise or shock at this time.  However did trial of Benadryl 25 mg p.o. without improvement.  There is no need for epinephrine.  Patient presents with symptoms that are most consistent with anxiety or other underlying psychiatric disorder that is undiagnosed at this time.  Do not believe that the presentation is consistent with an acute organic cause like delirium, drug-induced disorders including acute ingestions or withdrawals, or toxidrome.  Patient is not suicidal or homicidal.  She does not appear to be having a panic attack.  There is no acute indication for a psychiatric consult.  Discussed with the patient that she can be seen at the local health department if she is concerned about being poisoned by her husband.  She is agreeable to this.  She is safe to be discharged home at this time.  Final Clinical Impression(s) / ED Diagnoses Final diagnoses:  Itching of both hands    Rx / DC Orders ED Discharge Orders     None        Cristopher Peru, PA-C 07/31/21 0036    Benjiman Core, MD 08/03/21 [phone]

## 2021-07-31 NOTE — Discharge Instructions (Addendum)
You are seen in the emergency department today for having a sensation of itching and burning in her bilateral hands and feet.  Additionally you were feeling racing of your heart.  We performed a EKG while you are here which was normal.  Concerned that you were being poisoned by her husband 1.  At this time there are no emergent or life-threatening concerns on your exam.  You were given Benadryl while you were here to see if this would help the itching sensation.  You may feel drowsy from the Benadryl.  This will go away.  As we discussed at the bedside, at this time I would refer you to the [facility] department for your concerns regarding poisoning.  Please feel free to return to the emergency department for any reason.

## 2021-09-17 ENCOUNTER — Other Ambulatory Visit: Payer: Self-pay | Admitting: Internal Medicine

## 2021-09-17 ENCOUNTER — Ambulatory Visit
Admission: RE | Admit: 2021-09-17 | Discharge: 2021-09-17 | Disposition: A | Discharge status: Home / Self Care | Payer: BC Managed Care – PPO | Source: Ambulatory Visit | Attending: Internal Medicine | Admitting: Internal Medicine

## 2021-09-17 DIAGNOSIS — R059 Cough, unspecified: Secondary | ICD-10-CM

## 2021-09-17 DIAGNOSIS — R918 Other nonspecific abnormal finding of lung field: Secondary | ICD-10-CM

## 2021-10-08 IMAGING — CR DG CHEST 2V
2 series · 2 of 2 positions shown · non-contrast
Comparison: 08/17/2007

CLINICAL DATA: Tachycardia

EXAM:
CHEST - 2 VIEW

[w chest pa]
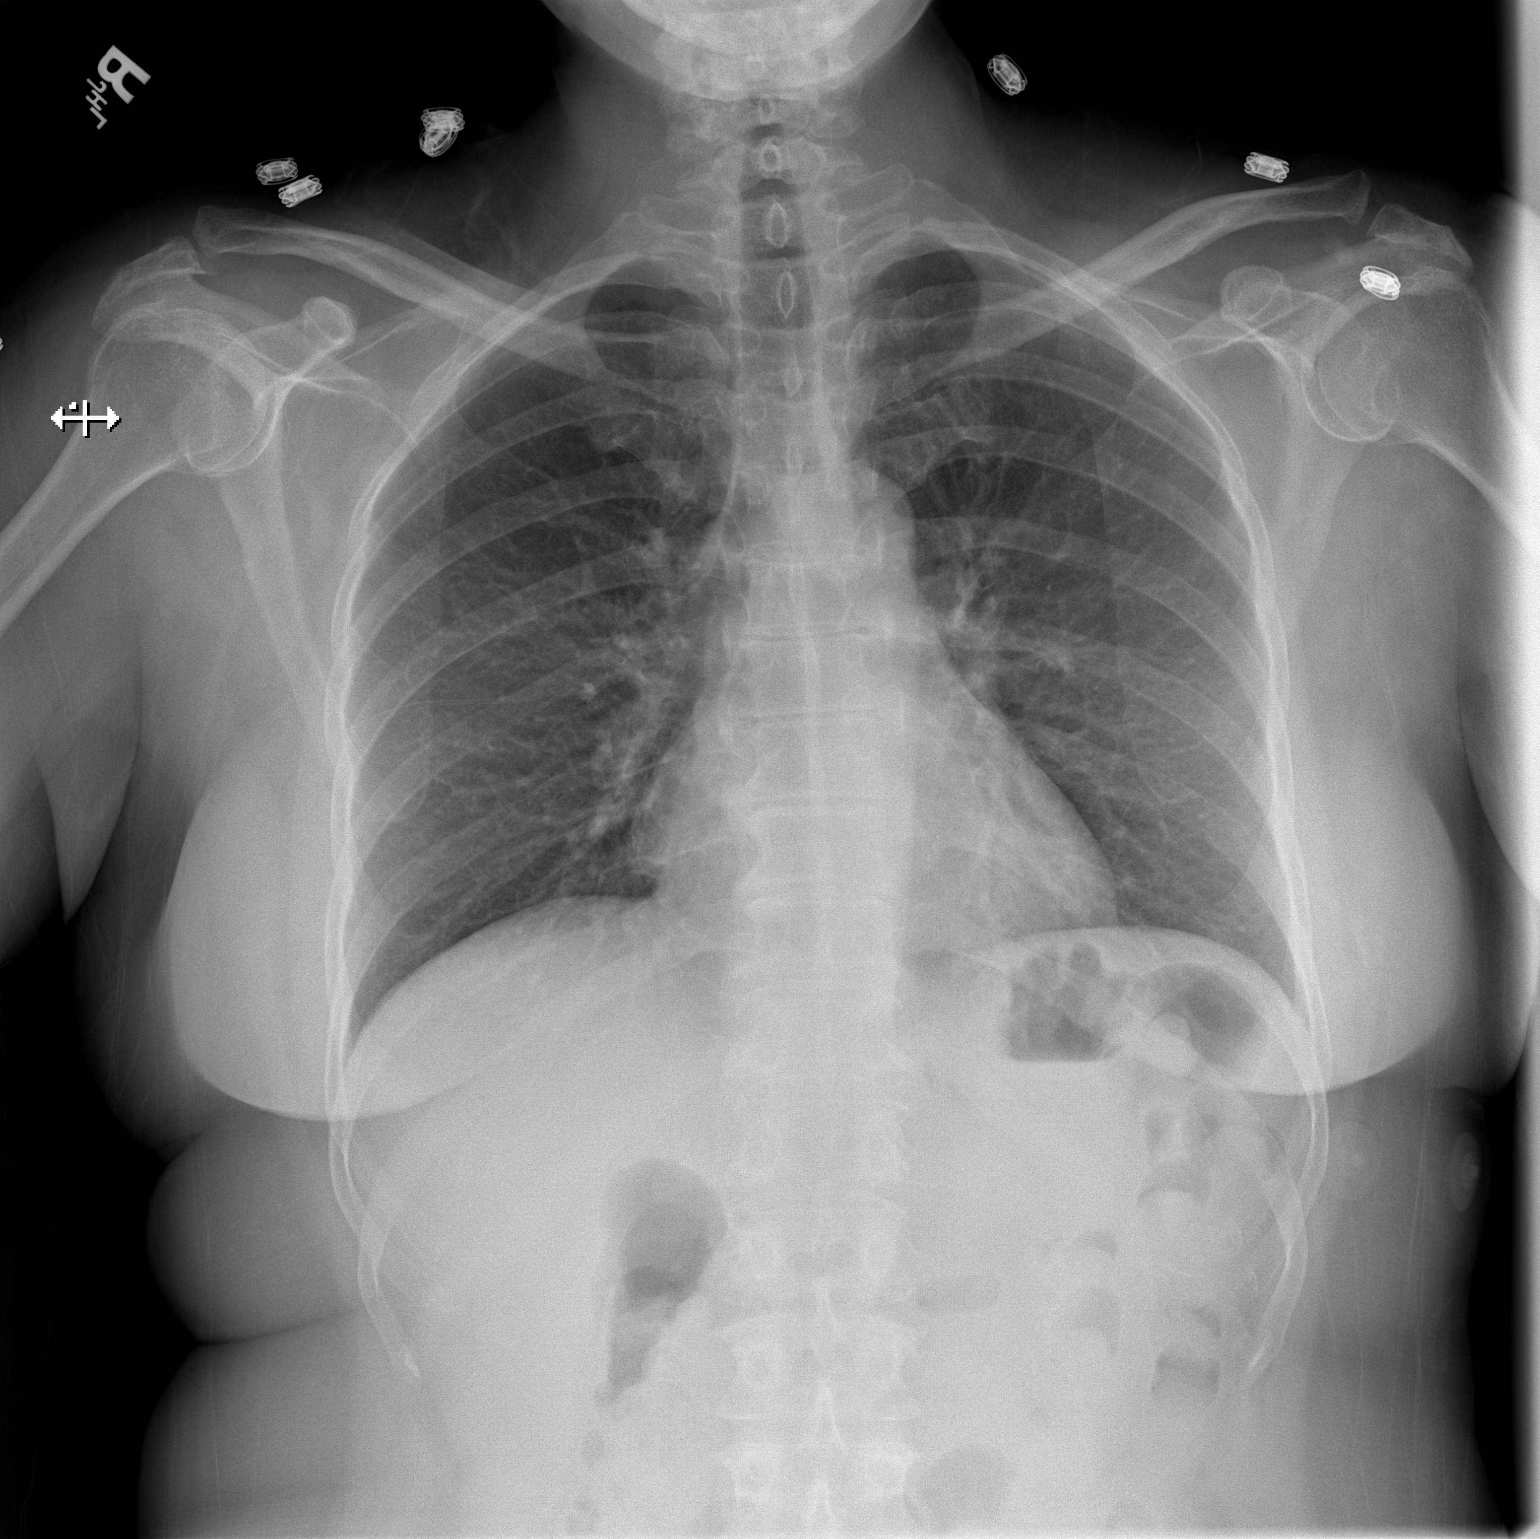

[w chest lat]
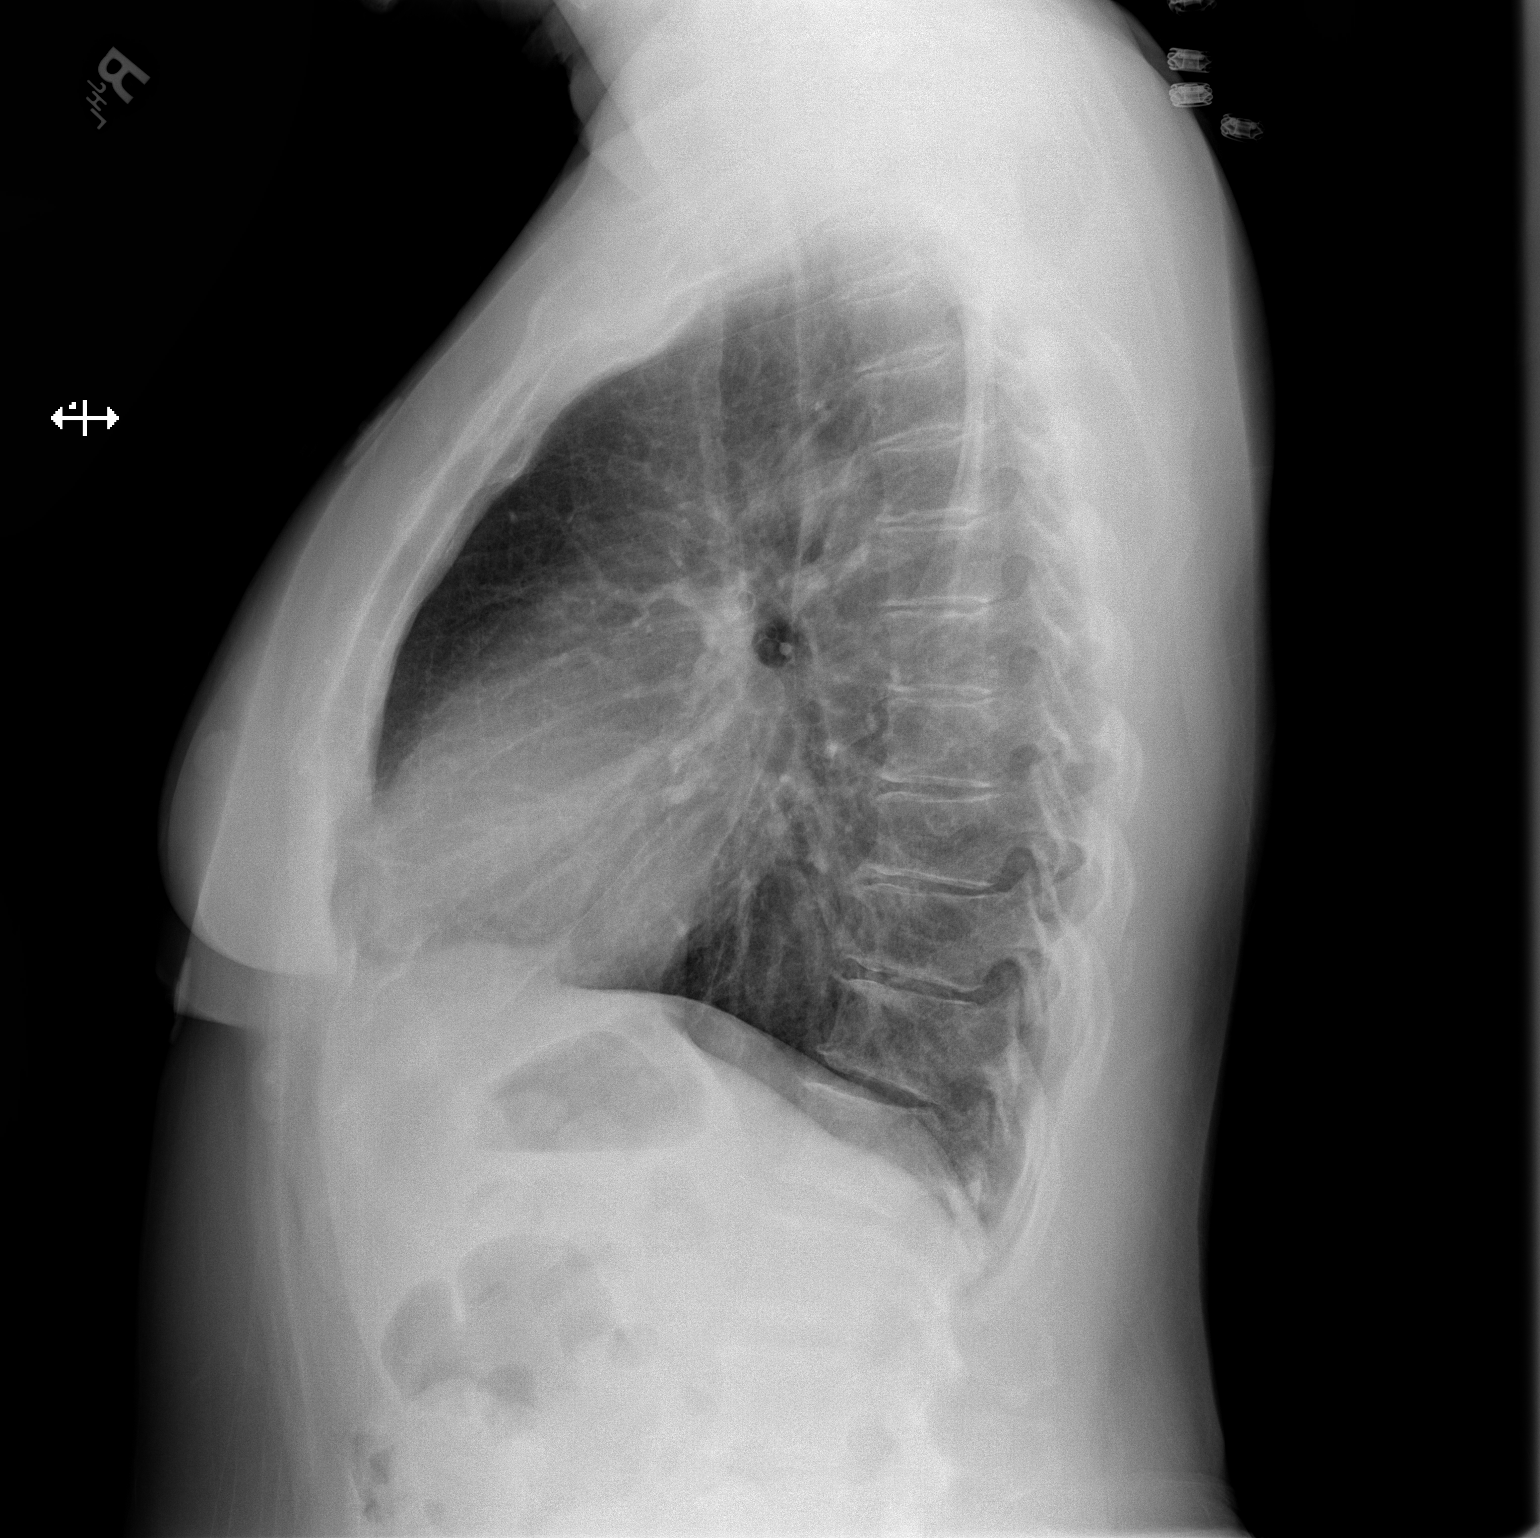

[2 of 2 positions shown; findings below may reference images not displayed]

FINDINGS: The heart size and mediastinal contours are within normal limits.
Both lungs are clear. The visualized skeletal structures are
unremarkable.
IMPRESSION: No active cardiopulmonary disease.

## 2022-05-05 ENCOUNTER — Other Ambulatory Visit: Payer: Self-pay | Admitting: Internal Medicine

## 2022-05-05 ENCOUNTER — Ambulatory Visit
Admission: RE | Admit: 2022-05-05 | Discharge: 2022-05-05 | Disposition: A | Discharge status: Home / Self Care | Payer: BC Managed Care – PPO | Source: Ambulatory Visit | Attending: Internal Medicine | Admitting: Internal Medicine

## 2022-05-05 DIAGNOSIS — M25552 Pain in left hip: Secondary | ICD-10-CM

## 2022-05-05 DIAGNOSIS — G8929 Other chronic pain: Secondary | ICD-10-CM

## 2024-04-15 ENCOUNTER — Other Ambulatory Visit: Payer: Self-pay | Admitting: Gastroenterology

## 2024-04-15 DIAGNOSIS — R110 Nausea: Secondary | ICD-10-CM

## 2024-04-22 ENCOUNTER — Ambulatory Visit
Admission: RE | Admit: 2024-04-22 | Discharge: 2024-04-22 | Disposition: A | Discharge status: Home / Self Care | Source: Ambulatory Visit | Attending: Gastroenterology | Admitting: Gastroenterology

## 2024-04-22 DIAGNOSIS — R110 Nausea: Secondary | ICD-10-CM
